# Patient Record
Sex: Male | Born: 1985 | Hispanic: Yes | Marital: Single | Smoking: Former smoker
Health system: Southern US, Community
[De-identification: ages and names within clinical notes are randomized; demographics above are authoritative.]

---

## 2012-05-19 ENCOUNTER — Emergency Department (HOSPITAL_COMMUNITY)
Admission: EM | Admit: 2012-05-19 | Discharge: 2012-05-20 | Disposition: A | Discharge status: Home / Self Care | Payer: No Typology Code available for payment source | Attending: Emergency Medicine | Admitting: Emergency Medicine

## 2012-05-19 ENCOUNTER — Emergency Department (HOSPITAL_COMMUNITY): Payer: Self-pay

## 2012-05-19 ENCOUNTER — Emergency Department (HOSPITAL_COMMUNITY): Payer: No Typology Code available for payment source

## 2012-05-19 ENCOUNTER — Encounter (HOSPITAL_COMMUNITY): Payer: Self-pay | Admitting: Emergency Medicine

## 2012-05-19 DIAGNOSIS — M79609 Pain in unspecified limb: Secondary | ICD-10-CM | POA: Insufficient documentation

## 2012-05-19 DIAGNOSIS — F10929 Alcohol use, unspecified with intoxication, unspecified: Secondary | ICD-10-CM

## 2012-05-19 DIAGNOSIS — F1010 Alcohol abuse, uncomplicated: Secondary | ICD-10-CM | POA: Insufficient documentation

## 2012-05-19 DIAGNOSIS — M25552 Pain in left hip: Secondary | ICD-10-CM

## 2012-05-19 DIAGNOSIS — M25559 Pain in unspecified hip: Secondary | ICD-10-CM | POA: Insufficient documentation

## 2012-05-19 DIAGNOSIS — T1490XA Injury, unspecified, initial encounter: Secondary | ICD-10-CM | POA: Insufficient documentation

## 2012-05-19 DIAGNOSIS — IMO0002 Reserved for concepts with insufficient information to code with codable children: Secondary | ICD-10-CM | POA: Insufficient documentation

## 2012-05-19 LAB — CBC
Hemoglobin: 18 g/dL — ABNORMAL HIGH (ref 13.0–17.0)
MCH: 31.7 pg (ref 26.0–34.0)
MCHC: 36 g/dL (ref 30.0–36.0)
Platelets: 264 10*3/uL (ref 150–400)
RDW: 13.1 % (ref 11.5–15.5)

## 2012-05-19 LAB — SAMPLE TO BLOOD BANK

## 2012-05-19 LAB — PROTIME-INR: Prothrombin Time: 13.3 seconds (ref 11.6–15.2)

## 2012-05-19 LAB — COMPREHENSIVE METABOLIC PANEL
ALT: 410 U/L — ABNORMAL HIGH (ref 0–53)
AST: 154 U/L — ABNORMAL HIGH (ref 0–37)
Alkaline Phosphatase: 94 U/L (ref 39–117)
CO2: 21 mEq/L (ref 19–32)
Chloride: 100 mEq/L (ref 96–112)
GFR calc Af Amer: >90 mL/min (ref >90)
GFR calc non Af Amer: >90 mL/min (ref >90)
Glucose, Bld: 108 mg/dL — ABNORMAL HIGH (ref 70–99)
Sodium: 139 mEq/L (ref 135–145)
Total Bilirubin: 0.3 mg/dL (ref 0.3–1.2)

## 2012-05-19 LAB — POCT I-STAT, CHEM 8
BUN: 4 mg/dL — ABNORMAL LOW (ref 6–23)
Hemoglobin: 19 g/dL — ABNORMAL HIGH (ref 13.0–17.0)
Potassium: 3.6 mEq/L (ref 3.5–5.1)
Sodium: 142 mEq/L (ref 135–145)
TCO2: 21 mmol/L (ref 0–100)

## 2012-05-19 LAB — ETHANOL: Alcohol, Ethyl (B): 301 mg/dL — ABNORMAL HIGH (ref 0–11)

## 2012-05-19 MED ORDER — ONDANSETRON HCL 4 MG/2ML IJ SOLN
4.0000 mg | Freq: Once | INTRAMUSCULAR | Status: AC
  Administered 2012-05-20: 4 mg via INTRAVENOUS
  Filled 2012-05-19: qty 2

## 2012-05-19 MED ORDER — SODIUM CHLORIDE 0.9 % IV BOLUS (SEPSIS)
1000.0000 mL | Freq: Once | INTRAVENOUS | Status: AC
  Administered 2012-05-20: 1000 mL via INTRAVENOUS

## 2012-05-19 MED ORDER — FENTANYL CITRATE 0.05 MG/ML IJ SOLN
50.0000 ug | Freq: Once | INTRAMUSCULAR | Status: AC
  Administered 2012-05-20: 50 ug via INTRAVENOUS
  Filled 2012-05-19: qty 2
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
## 2012-05-19 NOTE — ED Notes (Signed)
Patient transported to X-ray 

## 2012-05-19 NOTE — Progress Notes (Signed)
Orthopedic Tech Progress Note Patient Details:  David Calhoun 05/09/1982 [id number]  Patient ID: [name], male   DOB: 05/09/1982, 26 y.o.   MRN: [id number] Made trauma visit  Nikki Dom 05/19/2012, 9:32 PM
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
## 2012-05-20 LAB — URINALYSIS, MICROSCOPIC ONLY
Glucose, UA: NEGATIVE mg/dL
Ketones, ur: NEGATIVE mg/dL
Leukocytes, UA: NEGATIVE
Protein, ur: NEGATIVE mg/dL
Urobilinogen, UA: 0.2 mg/dL (ref 0.0–1.0)

## 2012-05-20 LAB — CDS SEROLOGY

## 2012-05-20 MED ORDER — HYDROCODONE-ACETAMINOPHEN 5-500 MG PO TABS: 1.0000 | ORAL_TABLET | Freq: Four times a day (QID) | ORAL | Status: AC | PRN

## 2012-05-20 MED ORDER — IBUPROFEN 600 MG PO TABS: 600.0000 mg | ORAL_TABLET | Freq: Four times a day (QID) | ORAL | Status: AC | PRN

## 2012-05-20 NOTE — ED Provider Notes (Signed)
I saw and evaluated the patient, reviewed the resident's note and I agree with the findings and plan.  Adja Ruff T Nyasha Rahilly, MD 05/20/12 0926 

## 2013-03-07 ENCOUNTER — Emergency Department (HOSPITAL_COMMUNITY): Payer: Self-pay

## 2013-03-07 ENCOUNTER — Emergency Department (HOSPITAL_COMMUNITY)
Admission: EM | Admit: 2013-03-07 | Discharge: 2013-03-07 | Disposition: A | Discharge status: Home / Self Care | Payer: Self-pay | Attending: Emergency Medicine | Admitting: Emergency Medicine

## 2013-03-07 ENCOUNTER — Encounter (HOSPITAL_COMMUNITY): Payer: Self-pay | Admitting: *Deleted

## 2013-03-07 DIAGNOSIS — W268XXA Contact with other sharp object(s), not elsewhere classified, initial encounter: Secondary | ICD-10-CM | POA: Insufficient documentation

## 2013-03-07 DIAGNOSIS — Y9301 Activity, walking, marching and hiking: Secondary | ICD-10-CM | POA: Insufficient documentation

## 2013-03-07 DIAGNOSIS — S91311A Laceration without foreign body, right foot, initial encounter: Secondary | ICD-10-CM

## 2013-03-07 DIAGNOSIS — S91309A Unspecified open wound, unspecified foot, initial encounter: Secondary | ICD-10-CM | POA: Insufficient documentation

## 2013-03-07 DIAGNOSIS — M2550 Pain in unspecified joint: Secondary | ICD-10-CM | POA: Insufficient documentation

## 2013-03-07 DIAGNOSIS — Y9289 Other specified places as the place of occurrence of the external cause: Secondary | ICD-10-CM | POA: Insufficient documentation

## 2013-03-07 DIAGNOSIS — Z23 Encounter for immunization: Secondary | ICD-10-CM | POA: Insufficient documentation

## 2013-03-07 MED ORDER — IBUPROFEN 600 MG PO TABS: 600.0000 mg | ORAL_TABLET | Freq: Four times a day (QID) | ORAL | Status: DC | PRN

## 2013-03-07 MED ORDER — TETANUS-DIPHTH-ACELL PERTUSSIS 5-2.5-18.5 LF-MCG/0.5 IM SUSP
0.5000 mL | Freq: Once | INTRAMUSCULAR | Status: AC
  Administered 2013-03-07: 0.5 mL via INTRAMUSCULAR
  Filled 2013-03-07: qty 0.5

## 2013-03-07 MED ORDER — BACITRACIN ZINC 500 UNIT/GM EX OINT: TOPICAL_OINTMENT | Freq: Two times a day (BID) | CUTANEOUS | Status: DC

## 2013-03-07 MED ORDER — CEPHALEXIN 500 MG PO CAPS: 500.0000 mg | ORAL_CAPSULE | Freq: Two times a day (BID) | ORAL | Status: DC
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
## 2013-03-07 NOTE — ED Provider Notes (Signed)
Medical screening examination/treatment/procedure(s) were performed by non-physician practitioner and as supervising physician I was immediately available for consultation/collaboration.   Richardean Canal, MD 03/07/13 [phone]

## 2013-03-07 NOTE — ED Notes (Signed)
Patient transported to X-ray 
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
## 2013-03-07 NOTE — ED Notes (Signed)
Lac to bottom of right foot. Bleeding controlled

## 2013-03-17 ENCOUNTER — Encounter (HOSPITAL_COMMUNITY): Payer: Self-pay | Admitting: Emergency Medicine

## 2013-03-17 ENCOUNTER — Emergency Department (HOSPITAL_COMMUNITY)
Admission: EM | Admit: 2013-03-17 | Discharge: 2013-03-18 | Disposition: A | Discharge status: Home / Self Care | Payer: Self-pay | Attending: Emergency Medicine | Admitting: Emergency Medicine

## 2013-03-17 DIAGNOSIS — IMO0002 Reserved for concepts with insufficient information to code with codable children: Secondary | ICD-10-CM

## 2013-03-17 DIAGNOSIS — R45851 Suicidal ideations: Secondary | ICD-10-CM | POA: Insufficient documentation

## 2013-03-17 DIAGNOSIS — S51809A Unspecified open wound of unspecified forearm, initial encounter: Secondary | ICD-10-CM | POA: Insufficient documentation

## 2013-03-17 DIAGNOSIS — F1010 Alcohol abuse, uncomplicated: Secondary | ICD-10-CM | POA: Diagnosis present

## 2013-03-17 DIAGNOSIS — X789XXA Intentional self-harm by unspecified sharp object, initial encounter: Secondary | ICD-10-CM

## 2013-03-17 DIAGNOSIS — F17200 Nicotine dependence, unspecified, uncomplicated: Secondary | ICD-10-CM | POA: Insufficient documentation

## 2013-03-17 LAB — COMPREHENSIVE METABOLIC PANEL
ALT: 181 U/L — ABNORMAL HIGH (ref 0–53)
AST: 52 U/L — ABNORMAL HIGH (ref 0–37)
Albumin: 4.4 g/dL (ref 3.5–5.2)
Alkaline Phosphatase: 86 U/L (ref 39–117)
Calcium: 9.2 mg/dL (ref 8.4–10.5)
GFR calc Af Amer: >90 mL/min (ref >90)
Glucose, Bld: 111 mg/dL — ABNORMAL HIGH (ref 70–99)
Potassium: 3.8 mEq/L (ref 3.5–5.1)
Sodium: 140 mEq/L (ref 135–145)
Total Protein: 8.1 g/dL (ref 6.0–8.3)

## 2013-03-17 LAB — CBC WITH DIFFERENTIAL/PLATELET
Basophils Absolute: 0 10*3/uL (ref 0.0–0.1)
Eosinophils Absolute: 0.1 10*3/uL (ref 0.0–0.7)
Eosinophils Relative: 1 % (ref 0–5)
Lymphocytes Relative: 34 % (ref 12–46)
Lymphs Abs: 3.3 10*3/uL (ref 0.7–4.0)
MCH: 30.7 pg (ref 26.0–34.0)
Neutrophils Relative %: 56 % (ref 43–77)
Platelets: 312 10*3/uL (ref 150–400)
RBC: 5.5 MIL/uL (ref 4.22–5.81)
RDW: 12.9 % (ref 11.5–15.5)
WBC: 9.6 10*3/uL (ref 4.0–10.5)

## 2013-03-17 LAB — ETHANOL: Alcohol, Ethyl (B): 343 mg/dL — ABNORMAL HIGH (ref 0–11)
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
## 2013-03-17 NOTE — ED Provider Notes (Signed)
History    CSN: [id number] Arrival date & time 03/17/13  2149  First MD Initiated Contact with Patient 03/17/13 2159     Chief Complaint  Patient presents with  . Suicidal  . Alcohol Intoxication   (Consider location/radiation/quality/duration/timing/severity/associated sxs/prior Treatment) HPI Comments: 27 yo male, Spanish speaking, interpreter used presents with suicidal ideation and attempt by cutting left forearm with a knife.  Pt denies past attempts or psych hx.  Pt drinks etoh regularly, 40 beers today.  Pt has multiple stressors, gf recently left him.  Denies ingestion.  Left arm bleeding/ cut.  Tetanus UTD.  Patient is a 27 y.o. male presenting with intoxication. The history is provided by the patient.  Alcohol Intoxication This is a recurrent problem. Pertinent negatives include no chest pain, no abdominal pain, no headaches and no shortness of breath.   History reviewed. No pertinent past medical history. History reviewed. No pertinent past surgical history. History reviewed. No pertinent family history. History  Substance Use Topics  . Smoking status: Current Some Day Smoker  . Smokeless tobacco: Not on file  . Alcohol Use: Yes     Comment: occasional    Review of Systems  Constitutional: Negative for fever and chills.  HENT: Negative for neck pain and neck stiffness.   Eyes: Negative for visual disturbance.  Respiratory: Negative for shortness of breath.   Cardiovascular: Negative for chest pain.  Gastrointestinal: Negative for vomiting and abdominal pain.  Genitourinary: Negative for dysuria and flank pain.  Musculoskeletal: Negative for back pain.  Skin: Positive for wound.  Neurological: Negative for light-headedness and headaches.  Psychiatric/Behavioral: Positive for suicidal ideas, self-injury and dysphoric mood.    Allergies  Review of patient's allergies indicates no known allergies.  Home Medications   Current Outpatient Rx  Name  Route  Sig   Dispense  Refill  . bacitracin ointment   Topical   Apply topically 2 (two) times daily.   120 g   0   . calcium carbonate (TUMS - DOSED IN MG ELEMENTAL CALCIUM) 500 MG chewable tablet   Oral   Chew 1 tablet by mouth daily.         . cephALEXin (KEFLEX) 500 MG capsule   Oral   Take 1 capsule (500 mg total) by mouth 2 (two) times daily.   14 capsule   0   . ibuprofen (ADVIL,MOTRIN) 600 MG tablet   Oral   Take 1 tablet (600 mg total) by mouth every 6 (six) hours as needed for pain.   30 tablet   0    BP 131/58  Pulse 107  Temp(Src) 98.5 F (36.9 C) (Oral)  Resp 18  SpO2 94% Physical Exam  Nursing note and vitals reviewed. Constitutional: He is oriented to person, place, and time. He appears well-developed and well-nourished.  HENT:  Head: Normocephalic and atraumatic.  Eyes: Conjunctivae are normal. Right eye exhibits no discharge. Left eye exhibits no discharge.  Neck: Normal range of motion. Neck supple. No tracheal deviation present.  Cardiovascular: Normal rate and regular rhythm.   Pulmonary/Chest: Effort normal and breath sounds normal.  Abdominal: Soft. He exhibits no distension. There is no tenderness. There is no guarding.  Musculoskeletal: He exhibits tenderness. He exhibits no edema.  Neurological: He is alert and oriented to person, place, and time. No cranial nerve deficit.  Sensation intact, full grip strength in flexion and full extension of all fingers.   2+ distal pulses in left arm Intermittently joking/ smiling. Clinically intoxicated  Skin: Skin is warm. No rash noted.  6 cm transverse lac to mid forearm with adipose visible/ mild bleeding  Psychiatric: He has a normal mood and affect.    ED Course  Procedures (including critical care time) Labs Reviewed  CBC WITH DIFFERENTIAL  COMPREHENSIVE METABOLIC PANEL  SALICYLATE LEVEL  ACETAMINOPHEN LEVEL  ETHANOL   No results found. No diagnosis found.  MDM  Suicidal and intoxicated.   Plan  for assessment by ACT/ psychi in am when sober. Difficult to assess risk at this time. Pt states wishes he was not alive at this time. LACERATION REPAIR Performed by: Enid Skeens Authorized by: Enid Skeens Consent: Verbal consent obtained. Risks and benefits: risks, benefits and alternatives were discussed Consent given by: patient Patient identity confirmed: provided demographic data Prepped and Draped in normal sterile fashion Wound explored  Laceration Location: left forearm  Laceration Length: 6 cm  No Foreign Bodies seen or palpated  Anesthesia: local infiltration  Local anesthetic: lidocaine 1% 8 ml    Irrigation method: syringe Amount of cleaning: standard  Skin closure: ethilon  Number of sutures: 7  Technique: individual  Patient tolerance: Patient tolerated the procedure well with no immediate complications.  Signed out for night physician to follow and psych in am.    Enid Skeens, MD 03/18/13 [phone]

## 2013-03-18 ENCOUNTER — Encounter (HOSPITAL_COMMUNITY): Payer: Self-pay | Admitting: Registered Nurse

## 2013-03-18 DIAGNOSIS — F1010 Alcohol abuse, uncomplicated: Secondary | ICD-10-CM

## 2013-03-18 DIAGNOSIS — R45851 Suicidal ideations: Secondary | ICD-10-CM

## 2013-03-18 DIAGNOSIS — X789XXA Intentional self-harm by unspecified sharp object, initial encounter: Secondary | ICD-10-CM

## 2013-03-18 LAB — RAPID URINE DRUG SCREEN, HOSP PERFORMED
Amphetamines: NOT DETECTED
Benzodiazepines: NOT DETECTED
Cocaine: NOT DETECTED
Opiates: NOT DETECTED
Tetrahydrocannabinol: NOT DETECTED

## 2013-03-18 MED ORDER — NAPROXEN 500 MG PO TABS: 500.0000 mg | ORAL_TABLET | Freq: Two times a day (BID) | ORAL | Status: DC
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
## 2013-03-18 NOTE — Progress Notes (Signed)
[facility] has seen patient and provided him with a [facility] oc application.

## 2013-03-18 NOTE — Consult Note (Signed)
Reason for Consult: Evaluation for inpatient treatment Referring Physician:  EDP  Branch Sanders is an 27 y.o. male.  HPI:  Patient presented to [facility] after cutting himself with knife.  David Calhoun states that he was drunk and doesn't know why he cut himself.  Patient states that he does not have a history of and psych. problem.  Patient denies SI/HI/psychosis/paranoia.  Patient states that he has no intention to kill himself.  Patient states that he lives with his brother in an apartment and his parents lives close by.  Patient does states that at time he goes on alcohol binges and ins interested in outpatient resources for alcohol and drug services.  History reviewed. No pertinent past medical history.  History reviewed. No pertinent past surgical history.  History reviewed. No pertinent family history.  Social History:  reports that he has been smoking.  He does not have any smokeless tobacco history on file. He reports that  drinks alcohol. He reports that he does not use illicit drugs.  Allergies: No Known Allergies  Medications: I have reviewed the patient's current medications.  Results for orders placed during the hospital encounter of 03/17/13 (from the past 48 hour(s))  CBC WITH DIFFERENTIAL     Status: None   Collection Time    03/17/13 10:37 PM      Result Value Range   WBC 9.6  4.0 - 10.5 K/uL   RBC 5.50  4.22 - 5.81 MIL/uL   Hemoglobin 16.9  13.0 - 17.0 g/dL   HCT 95.6  21.3 - 08.6 %   MCV 86.4  78.0 - 100.0 fL   MCH 30.7  26.0 - 34.0 pg   MCHC 35.6  30.0 - 36.0 g/dL   RDW 57.8  46.9 - 62.9 %   Platelets 312  150 - 400 K/uL   Neutrophils Relative % 56  43 - 77 %   Neutro Abs 5.4  1.7 - 7.7 K/uL   Lymphocytes Relative 34  12 - 46 %   Lymphs Abs 3.3  0.7 - 4.0 K/uL   Monocytes Relative 9  3 - 12 %   Monocytes Absolute 0.8  0.1 - 1.0 K/uL   Eosinophils Relative 1  0 - 5 %   Eosinophils Absolute 0.1  0.0 - 0.7 K/uL   Basophils Relative 0  0 - 1 %   Basophils  Absolute 0.0  0.0 - 0.1 K/uL  COMPREHENSIVE METABOLIC PANEL     Status: Abnormal   Collection Time    03/17/13 10:37 PM      Result Value Range   Sodium 140  135 - 145 mEq/L   Potassium 3.8  3.5 - 5.1 mEq/L   Chloride 102  96 - 112 mEq/L   CO2 21  19 - 32 mEq/L   Glucose, Bld 111 (*) 70 - 99 mg/dL   BUN 7  6 - 23 mg/dL   Creatinine, Ser 5.28  0.50 - 1.35 mg/dL   Calcium 9.2  8.4 - 41.3 mg/dL   Total Protein 8.1  6.0 - 8.3 g/dL   Albumin 4.4  3.5 - 5.2 g/dL   AST 52 (*) 0 - 37 U/L   ALT 181 (*) 0 - 53 U/L   Alkaline Phosphatase 86  39 - 117 U/L   Total Bilirubin 0.2 (*) 0.3 - 1.2 mg/dL   GFR calc non Af Amer >90  >90 mL/min   GFR calc Af Amer >90  >90 mL/min   Comment:  The eGFR has been calculated     using the CKD EPI equation.     This calculation has not been     validated in all clinical     situations.     eGFR's persistently     <90 mL/min signify     possible Chronic Kidney Disease.  SALICYLATE LEVEL     Status: Abnormal   Collection Time    03/17/13 10:37 PM      Result Value Range   Salicylate Lvl <2.0 (*) 2.8 - 20.0 mg/dL  ACETAMINOPHEN LEVEL     Status: None   Collection Time    03/17/13 10:37 PM      Result Value Range   Acetaminophen (Tylenol), Serum <15.0  10 - 30 ug/mL   Comment:            THERAPEUTIC CONCENTRATIONS VARY     SIGNIFICANTLY. A RANGE OF 10-30     ug/mL MAY BE AN EFFECTIVE     CONCENTRATION FOR MANY PATIENTS.     HOWEVER, SOME ARE BEST TREATED     AT CONCENTRATIONS OUTSIDE THIS     RANGE.     ACETAMINOPHEN CONCENTRATIONS     >150 ug/mL AT 4 HOURS AFTER     INGESTION AND >50 ug/mL AT 12     HOURS AFTER INGESTION ARE     OFTEN ASSOCIATED WITH TOXIC     REACTIONS.  ETHANOL     Status: Abnormal   Collection Time    03/17/13 10:37 PM      Result Value Range   Alcohol, Ethyl (B) 343 (*) 0 - 11 mg/dL   Comment:            LOWEST DETECTABLE LIMIT FOR     SERUM ALCOHOL IS 11 mg/dL     FOR MEDICAL PURPOSES ONLY  URINE RAPID  DRUG SCREEN (HOSP PERFORMED)     Status: None   Collection Time    03/17/13 10:52 PM      Result Value Range   Opiates NONE DETECTED  NONE DETECTED   Cocaine NONE DETECTED  NONE DETECTED   Benzodiazepines NONE DETECTED  NONE DETECTED   Amphetamines NONE DETECTED  NONE DETECTED   Tetrahydrocannabinol NONE DETECTED  NONE DETECTED   Barbiturates NONE DETECTED  NONE DETECTED   Comment:            DRUG SCREEN FOR MEDICAL PURPOSES     ONLY.  IF CONFIRMATION IS NEEDED     FOR ANY PURPOSE, NOTIFY LAB     WITHIN 5 DAYS.                LOWEST DETECTABLE LIMITS     FOR URINE DRUG SCREEN     Drug Class       Cutoff (ng/mL)     Amphetamine      1000     Barbiturate      200     Benzodiazepine   200     Tricyclics       300     Opiates          300     Cocaine          300     THC              50    No results found.  Review of Systems  Psychiatric/Behavioral: Positive for substance abuse (Alcohol). Negative for depression, suicidal ideas (David Calhoun states that he was intoxicated  and had no intention of trying to kill himself.  States that he feel "because I was drunk.  I don't want to die and wasn't trying to kill myself."), hallucinations and memory loss. The patient is not nervous/anxious and does not have insomnia.   All other systems reviewed and are negative.   Blood pressure 144/81, pulse 108, temperature 99.1 F (37.3 C), temperature source Oral, resp. rate 17, SpO2 98.00%. Physical Exam  Constitutional: He is oriented to person, place, and time. He appears well-developed and well-nourished.  HENT:  Head: Normocephalic and atraumatic.  Neck: Normal range of motion. Neck supple.  Respiratory: Effort normal.  Musculoskeletal: Normal range of motion.  Neurological: He is alert and oriented to person, place, and time.  Skin: Skin is warm and dry.  Self inflicted laceration to left inner forearm with sutures.   Psychiatric: His speech is normal and behavior is normal. Judgment and  thought content normal. His mood appears not anxious. His affect is not angry. He is not agitated, not aggressive, not withdrawn, not actively hallucinating and not combative. Thought content is not paranoid and not delusional. Cognition and memory are normal. He does not exhibit a depressed mood. He expresses no homicidal ideation.   Face to face interview and consult with Dr. Lolly Mustache Assessment/Plan: Axis I: Suicidal Ideation and Suicidal Attempt   Recommendation:  Discharge home with Resources for drug/alcohol assistance programs  Rankin, Shuvon,FNP-BC 03/18/2013, 12:49 PM     I have personally seen the patient and agreed with the findings and involved in the treatment plan. Kathryne Sharper, MD

## 2013-03-18 NOTE — ED Provider Notes (Signed)
1:15 AM d/w [name] from [facility] - she will evaluate when sober  David Nielsen, MD 03/18/13 [phone]

## 2013-03-24 ENCOUNTER — Encounter (HOSPITAL_COMMUNITY): Payer: Self-pay | Admitting: *Deleted

## 2013-03-24 ENCOUNTER — Emergency Department (HOSPITAL_COMMUNITY)
Admission: EM | Admit: 2013-03-24 | Discharge: 2013-03-24 | Disposition: A | Discharge status: Home / Self Care | Payer: Self-pay | Attending: Emergency Medicine | Admitting: Emergency Medicine

## 2013-03-24 DIAGNOSIS — Z4802 Encounter for removal of sutures: Secondary | ICD-10-CM | POA: Insufficient documentation

## 2013-03-24 DIAGNOSIS — F17200 Nicotine dependence, unspecified, uncomplicated: Secondary | ICD-10-CM | POA: Insufficient documentation

## 2013-03-24 DIAGNOSIS — Z79899 Other long term (current) drug therapy: Secondary | ICD-10-CM | POA: Insufficient documentation

## 2013-03-24 NOTE — ED Notes (Signed)
Pt is here to get stitches removed from L lower arm, has had x 1 week, no problems.

## 2013-03-24 NOTE — ED Provider Notes (Signed)
History    CSN: [id number] Arrival date & time 03/24/13  1137  First MD Initiated Contact with Patient 03/24/13 1159     Chief Complaint  Patient presents with  . Suture / Staple Removal   (Consider location/radiation/quality/duration/timing/severity/associated sxs/prior Treatment) HPI Comments: Patient presents for suture removal. The laceration is located on his left forearm. The patient reports subjective healing of the laceration and denies any complications or concerns. The patient denies any pain, erythema, tenderness, drainage of the site. Denies fever, NVD, abdominal pain, numbness/tingling, skin color changes.    Patient is a 27 y.o. male presenting with suture removal.  Suture / Staple Removal   History reviewed. No pertinent past medical history. History reviewed. No pertinent past surgical history. No family history on file. History  Substance Use Topics  . Smoking status: Current Some Day Smoker  . Smokeless tobacco: Not on file  . Alcohol Use: Yes     Comment: occasional    Review of Systems  Skin: Positive for wound.  All other systems reviewed and are negative.    Allergies  Review of patient's allergies indicates no known allergies.  Home Medications   Current Outpatient Rx  Name  Route  Sig  Dispense  Refill  . bacitracin ointment   Topical   Apply topically 2 (two) times daily.   120 g   0   . calcium carbonate (TUMS - DOSED IN MG ELEMENTAL CALCIUM) 500 MG chewable tablet   Oral   Chew 1 tablet by mouth daily.         . cephALEXin (KEFLEX) 500 MG capsule   Oral   Take 1 capsule (500 mg total) by mouth 2 (two) times daily.   14 capsule   0   . ibuprofen (ADVIL,MOTRIN) 600 MG tablet   Oral   Take 1 tablet (600 mg total) by mouth every 6 (six) hours as needed for pain.   30 tablet   0   . naproxen (NAPROSYN) 500 MG tablet   Oral   Take 1 tablet (500 mg total) by mouth 2 (two) times daily with a meal.   30 tablet   0    BP  123/70  Pulse 72  Temp(Src) 98.2 F (36.8 C) (Oral)  Resp 18  SpO2 100% Physical Exam  Nursing note and vitals reviewed. Constitutional: He is oriented to person, place, and time. He appears well-developed and well-nourished. No distress.  HENT:  Head: Normocephalic and atraumatic.  Eyes: Conjunctivae are normal.  Neck: Normal range of motion.  Cardiovascular: Normal rate and regular rhythm.  Exam reveals no gallop and no friction rub.   No murmur heard. Pulmonary/Chest: Effort normal and breath sounds normal. He has no wheezes. He has no rales. He exhibits no tenderness.  Abdominal: Soft. There is no tenderness.  Musculoskeletal: Normal range of motion.  Neurological: He is alert and oriented to person, place, and time. Coordination normal.  Speech is goal-oriented. Moves limbs without ataxia.   Skin: Skin is warm and dry.  4cm laceration of left forearm with sutures intact.   Psychiatric: He has a normal mood and affect. His behavior is normal.    ED Course  Procedures (including critical care time)  SUTURE REMOVAL Performed by: Emilia Beck  Consent: Verbal consent obtained. Patient identity confirmed: provided demographic data Time out: Immediately prior to procedure a "time out" was called to verify the correct patient, procedure, equipment, support staff and site/side marked as required.  Location details: left  forearm  Wound Appearance: clean  Sutures/Staples Removed: 2 (the remainder left intact due to non healing of the laceration)  Facility: sutures placed in this facility Patient tolerance: Patient tolerated the procedure well with no immediate complications.     Labs Reviewed - No data to display No results found. 1. Visit for suture removal     MDM  12:21 PM Patient's laceration is not fully healed. Patient instructed to return in 5 days for suture removal. Patient understands. No signs of infection noted.   Emilia Beck, PA-C 03/24/13  1225

## 2013-03-25 NOTE — ED Provider Notes (Signed)
Medical screening examination/treatment/procedure(s) were performed by non-physician practitioner and as supervising physician I was immediately available for consultation/collaboration.  Toy Baker, MD 03/25/13 [phone]

## 2013-03-30 ENCOUNTER — Encounter (HOSPITAL_COMMUNITY): Payer: Self-pay | Admitting: Emergency Medicine

## 2013-03-30 ENCOUNTER — Emergency Department (HOSPITAL_COMMUNITY)
Admission: EM | Admit: 2013-03-30 | Discharge: 2013-03-30 | Disposition: A | Discharge status: Home / Self Care | Payer: Self-pay | Attending: Emergency Medicine | Admitting: Emergency Medicine

## 2013-03-30 DIAGNOSIS — Z4802 Encounter for removal of sutures: Secondary | ICD-10-CM | POA: Insufficient documentation

## 2013-03-30 DIAGNOSIS — F17200 Nicotine dependence, unspecified, uncomplicated: Secondary | ICD-10-CM | POA: Insufficient documentation

## 2013-03-30 NOTE — ED Provider Notes (Signed)
CSN: [id number]     Arrival date & time 03/30/13  1106 History     First MD Initiated Contact with Patient 03/30/13 1107     Chief Complaint  Patient presents with  . Suture / Staple Removal   (Consider location/radiation/quality/duration/timing/severity/associated sxs/prior Treatment) HPI Comments: Patient is a 27 year old male who presents today for suture removal from a laceration on his left forearm. He sustained a laceration on 7/14. At that time a laceration repair was done with 7 sutures placed. He has not had any issues since that time. He was seen on 7/21 and told that his sutures are not ready to come out. At that time two sutures were taken out. No fevers, chills, nausea, vomiting, abdominal pain.  The history is provided by the patient. No language interpreter was used.    No past medical history on file. No past surgical history on file. No family history on file. History  Substance Use Topics  . Smoking status: Current Some Day Smoker  . Smokeless tobacco: Not on file  . Alcohol Use: Yes     Comment: occasional    Review of Systems  Constitutional: Negative for fever and chills.  Skin: Positive for wound.  All other systems reviewed and are negative.    Allergies  Review of patient's allergies indicates no known allergies.  Home Medications   Current Outpatient Rx  Name  Route  Sig  Dispense  Refill  . bacitracin ointment   Topical   Apply topically 2 (two) times daily.   120 g   0   . calcium carbonate (TUMS - DOSED IN MG ELEMENTAL CALCIUM) 500 MG chewable tablet   Oral   Chew 1 tablet by mouth daily.         . cephALEXin (KEFLEX) 500 MG capsule   Oral   Take 1 capsule (500 mg total) by mouth 2 (two) times daily.   14 capsule   0   . ibuprofen (ADVIL,MOTRIN) 600 MG tablet   Oral   Take 1 tablet (600 mg total) by mouth every 6 (six) hours as needed for pain.   30 tablet   0   . naproxen (NAPROSYN) 500 MG tablet   Oral   Take 1 tablet  (500 mg total) by mouth 2 (two) times daily with a meal.   30 tablet   0    BP 116/61  Pulse 77  Temp(Src) 98.8 F (37.1 C) (Oral)  SpO2 100% Physical Exam  Nursing note and vitals reviewed. Constitutional: He is oriented to person, place, and time. He appears well-developed and well-nourished. No distress.  HENT:  Head: Normocephalic and atraumatic.  Right Ear: External ear normal.  Left Ear: External ear normal.  Nose: Nose normal.  Eyes: Conjunctivae are normal.  Neck: Normal range of motion. No tracheal deviation present.  Cardiovascular: Normal rate, regular rhythm and normal heart sounds.   Pulmonary/Chest: Effort normal and breath sounds normal. No stridor.  Abdominal: Soft. He exhibits no distension. There is no tenderness.  Musculoskeletal: Normal range of motion.  Neurological: He is alert and oriented to person, place, and time.  Skin: Skin is warm and dry. He is not diaphoretic.  Well-healing laceration on left forearm. 5 Sutures in place.   Psychiatric: He has a normal mood and affect. His behavior is normal.    ED Course   Procedures (including critical care time)  SUTURE REMOVAL Performed by: Junious Silk  Consent: Verbal consent obtained. Patient identity confirmed: provided demographic  data Time out: Immediately prior to procedure a "time out" was called to verify the correct patient, procedure, equipment, support staff and site/side marked as required.  Location details: left forearm  Wound Appearance: clean  Sutures/Staples Removed: 5  Facility: sutures placed in this facility Patient tolerance: Patient tolerated the procedure well with no immediate complications.     Labs Reviewed - No data to display No results found. 1. Visit for suture removal     MDM  Pt to ER for staple/suture removal and wound check as above. Procedure tolerated well. Vitals normal, no signs of infection. Scar minimization & return precautions given at dc.    Mora Bellman, PA-C 03/30/13 1138

## 2013-03-30 NOTE — ED Provider Notes (Signed)
Medical screening examination/treatment/procedure(s) were performed by non-physician practitioner and as supervising physician I was immediately available for consultation/collaboration.   Aqil Goetting, MD 03/30/13 1534 

## 2013-03-30 NOTE — ED Notes (Signed)
L/forearm suture removal

## 2013-04-01 ENCOUNTER — Ambulatory Visit: Payer: Self-pay | Attending: Family Medicine

## 2013-12-17 ENCOUNTER — Encounter (HOSPITAL_COMMUNITY): Payer: Self-pay | Admitting: Emergency Medicine

## 2013-12-17 ENCOUNTER — Emergency Department (HOSPITAL_COMMUNITY): Payer: Self-pay

## 2013-12-17 ENCOUNTER — Inpatient Hospital Stay (HOSPITAL_COMMUNITY)
Admission: EM | Admit: 2013-12-17 | Discharge: 2013-12-18 | DRG: 053 | Disposition: A | Discharge status: Home / Self Care | Payer: Self-pay | Attending: Internal Medicine | Admitting: Internal Medicine

## 2013-12-17 ENCOUNTER — Inpatient Hospital Stay (HOSPITAL_COMMUNITY): Payer: Self-pay

## 2013-12-17 DIAGNOSIS — R5383 Other fatigue: Secondary | ICD-10-CM

## 2013-12-17 DIAGNOSIS — F1010 Alcohol abuse, uncomplicated: Secondary | ICD-10-CM | POA: Diagnosis present

## 2013-12-17 DIAGNOSIS — Z23 Encounter for immunization: Secondary | ICD-10-CM

## 2013-12-17 DIAGNOSIS — M25569 Pain in unspecified knee: Secondary | ICD-10-CM | POA: Diagnosis present

## 2013-12-17 DIAGNOSIS — R200 Anesthesia of skin: Secondary | ICD-10-CM

## 2013-12-17 DIAGNOSIS — R29898 Other symptoms and signs involving the musculoskeletal system: Secondary | ICD-10-CM

## 2013-12-17 DIAGNOSIS — R5381 Other malaise: Secondary | ICD-10-CM | POA: Diagnosis present

## 2013-12-17 DIAGNOSIS — R269 Unspecified abnormalities of gait and mobility: Secondary | ICD-10-CM | POA: Diagnosis present

## 2013-12-17 DIAGNOSIS — R197 Diarrhea, unspecified: Secondary | ICD-10-CM | POA: Diagnosis present

## 2013-12-17 DIAGNOSIS — G8220 Paraplegia, unspecified: Principal | ICD-10-CM | POA: Diagnosis present

## 2013-12-17 DIAGNOSIS — R209 Unspecified disturbances of skin sensation: Secondary | ICD-10-CM | POA: Diagnosis present

## 2013-12-17 DIAGNOSIS — W19XXXA Unspecified fall, initial encounter: Secondary | ICD-10-CM | POA: Diagnosis present

## 2013-12-17 DIAGNOSIS — F17200 Nicotine dependence, unspecified, uncomplicated: Secondary | ICD-10-CM | POA: Diagnosis present

## 2013-12-17 LAB — RAPID URINE DRUG SCREEN, HOSP PERFORMED
Amphetamines: NOT DETECTED
BARBITURATES: NOT DETECTED
Benzodiazepines: NOT DETECTED
Cocaine: NOT DETECTED
OPIATES: NOT DETECTED
TETRAHYDROCANNABINOL: NOT DETECTED

## 2013-12-17 LAB — ETHANOL: ALCOHOL ETHYL (B): 233 mg/dL — AB (ref 0–11)

## 2013-12-17 LAB — URINALYSIS, ROUTINE W REFLEX MICROSCOPIC
Bilirubin Urine: NEGATIVE
Glucose, UA: NEGATIVE mg/dL
HGB URINE DIPSTICK: NEGATIVE
KETONES UR: NEGATIVE mg/dL
Leukocytes, UA: NEGATIVE
Nitrite: NEGATIVE
PROTEIN: NEGATIVE mg/dL
Specific Gravity, Urine: 1.002 — ABNORMAL LOW (ref 1.005–1.030)
UROBILINOGEN UA: 0.2 mg/dL (ref 0.0–1.0)
pH: 6 (ref 5.0–8.0)

## 2013-12-17 LAB — CBC WITH DIFFERENTIAL/PLATELET
BASOS ABS: 0 10*3/uL (ref 0.0–0.1)
Basophils Relative: 0 % (ref 0–1)
Eosinophils Absolute: 0.1 10*3/uL (ref 0.0–0.7)
Eosinophils Relative: 1 % (ref 0–5)
HEMATOCRIT: 48.5 % (ref 39.0–52.0)
HEMOGLOBIN: 17.1 g/dL — AB (ref 13.0–17.0)
LYMPHS PCT: 25 % (ref 12–46)
Lymphs Abs: 2.3 10*3/uL (ref 0.7–4.0)
MCH: 31.1 pg (ref 26.0–34.0)
MCHC: 35.3 g/dL (ref 30.0–36.0)
MCV: 88.2 fL (ref 78.0–100.0)
MONO ABS: 0.8 10*3/uL (ref 0.1–1.0)
MONOS PCT: 9 % (ref 3–12)
NEUTROS ABS: 5.7 10*3/uL (ref 1.7–7.7)
Neutrophils Relative %: 64 % (ref 43–77)
Platelets: 279 10*3/uL (ref 150–400)
RBC: 5.5 MIL/uL (ref 4.22–5.81)
RDW: 12.9 % (ref 11.5–15.5)
WBC: 8.9 10*3/uL (ref 4.0–10.5)

## 2013-12-17 LAB — COMPREHENSIVE METABOLIC PANEL
ALK PHOS: 87 U/L (ref 39–117)
ALT: 71 U/L — ABNORMAL HIGH (ref 0–53)
AST: 47 U/L — AB (ref 0–37)
Albumin: 4.8 g/dL (ref 3.5–5.2)
BILIRUBIN TOTAL: 0.5 mg/dL (ref 0.3–1.2)
BUN: 9 mg/dL (ref 6–23)
CHLORIDE: 98 meq/L (ref 96–112)
CO2: 23 meq/L (ref 19–32)
CREATININE: 0.48 mg/dL — AB (ref 0.50–1.35)
Calcium: 9.9 mg/dL (ref 8.4–10.5)
GFR calc Af Amer: >90 mL/min (ref >90)
Glucose, Bld: 100 mg/dL — ABNORMAL HIGH (ref 70–99)
Potassium: 3.9 mEq/L (ref 3.7–5.3)
Sodium: 140 mEq/L (ref 137–147)
Total Protein: 8.6 g/dL — ABNORMAL HIGH (ref 6.0–8.3)

## 2013-12-17 LAB — HIV ANTIBODY (ROUTINE TESTING W REFLEX): HIV 1&2 Ab, 4th Generation: NONREACTIVE

## 2013-12-17 MED ORDER — ENOXAPARIN SODIUM 40 MG/0.4ML ~~LOC~~ SOLN
40.0000 mg | Freq: Every day | SUBCUTANEOUS | Status: DC
  Filled 2013-12-17: qty 0.4

## 2013-12-17 MED ORDER — GADOBENATE DIMEGLUMINE 529 MG/ML IV SOLN
20.0000 mL | Freq: Once | INTRAVENOUS | Status: AC
  Administered 2013-12-17: 20 mL via INTRAVENOUS

## 2013-12-17 MED ORDER — PNEUMOCOCCAL VAC POLYVALENT 25 MCG/0.5ML IJ INJ
0.5000 mL | INJECTION | INTRAMUSCULAR | Status: AC
  Administered 2013-12-18: 0.5 mL via INTRAMUSCULAR
  Filled 2013-12-17: qty 0.5

## 2013-12-17 MED ORDER — SODIUM CHLORIDE 0.9 % IV BOLUS (SEPSIS)
1000.0000 mL | Freq: Once | INTRAVENOUS | Status: AC
  Administered 2013-12-17: 1000 mL via INTRAVENOUS

## 2013-12-17 NOTE — ED Notes (Signed)
[employer] used to all information.

## 2013-12-17 NOTE — ED Notes (Signed)
Per [facility] pt reports no feeling and unable to move bilateral lower extremities from the knees down; delayed cape refill. Reports 4 beers tonight. No reflexes to lower extremities.

## 2013-12-17 NOTE — ED Notes (Signed)
MD at bedside. 

## 2013-12-17 NOTE — Progress Notes (Signed)
Pt seen by partner Dr. Leroy Kennedyamilo this am.   Patient reports sudden onset of symptoms. On exam, he has an inconsistent exam.  He has preserved reflexes at the knees and ankles.   With preserved reflexes, I feel that GBS is much less likley. Will get MRI brain and C-spine to rule out MS and observe overnight. Will hold off on LP for now as I feel that with sudden onset and preserved reflexes, this is likely low yield.   Ritta SlotMcNeill Kirkpatrick, MD [facility] [phone]  If 7pm- 7am, please page neurology on call as listed in [facility].

## 2013-12-17 NOTE — ED Provider Notes (Signed)
[facility], complains of bilateral lower extremity weakness and numbness. On my exam the patient has inability to straight-leg raise bilaterally however he does have some preserved strength proximally when I lift his legs and asked him to hold them in the air. He is resistant to bend his knee secondary to what he describes as needed pain, decreased sensation to the bilateral lower extremities below the knee to both light touch and pinprick. The patient is unable to ambulate secondary to lower extremity weakness. Neurology consultation requested and appreciated, recommends MRI of the lumbar and thoracic spines followed by spinal tap as inpatient if patient has no findings on MRI. The patient otherwise appears hemodynamically stable.  Change of shift - care signed out to oncoming physician  Vida RollerBrian D Hila Bolding, MD 12/17/13 [phone]

## 2013-12-17 NOTE — H&P (Signed)
Date: 12/17/2013               Patient Name:  David Calhoun MRN: [id number]  DOB: 02/23/1986 Age / Sex: 28 y.o., male   PCP: Pcp Not In System         Medical Service: Internal Medicine Teaching Service         Attending Physician: Dr. Burns SpainElizabeth A Butcher, MD    First Contact: Dr. Yetta BarreJones Pager: [phone]  Second Contact: Dr. Virgina OrganQureshi Pager: [phone]       After Hours (After 5p/  First Contact Pager: [phone]  weekends / holidays): Second Contact Pager: [phone]   Chief Complaint: paralysis of legs  History of Present Illness:  The patient is a 28 YO man with no significant past medical history who is presenting to the hospital with loss of function of his legs. He was drinking alcohol the night prior and went to bed. When he awoke he was unable to move his legs and had lost sensation from the knee down. He denies that anything else was out of the usual. He had some mild diarrhea on Monday however this has since resolved. He denies fevers, chills, SOB, chest pain. He has not been sick recently or around others who are sick. He denies any cold or flulike symptoms in the last month. He denies abdominal pain or nausea or vomiting. He does drink alcohol most nights. He sometimes smokes cigarettes but this is not a regular occurrence for him. He does not do any drugs including cocaine, heroin, marijuana. He does not take any medications that are prescribed for him or for anyone else. He denies headaches now or in the recent past. He denies loss of bladder or bowel. He has feeling from his knees up and no problems with that sensation. He is not having any change in vision or hearing. He denies current or past sexual activity. He is hopeful that his legs will start working again. No problems with his speech.   Meds: No current facility-administered medications for this encounter.   No current outpatient prescriptions on file.  None  Allergies: Allergies as of 12/17/2013  . (No Known  Allergies)   History reviewed. No pertinent past medical history. History reviewed. No pertinent past surgical history. History reviewed. No pertinent family history. History   Social History  . Marital Status: Single    Spouse Name: N/A    Number of Children: N/A  . Years of Education: N/A   Occupational History  . Not on file.   Social History Main Topics  . Smoking status: Current Some Day Smoker  . Smokeless tobacco: Not on file  . Alcohol Use: Yes     Comment: occasional  . Drug Use: No  . Sexual Activity: Not on file   Other Topics Concern  . Not on file   Social History Narrative  . No narrative on file    Review of Systems: A comprehensive 10 point review of systems was performed and other than pertinent positives and negatives listed above in HPI was negative.   Physical Exam: Blood pressure 148/85, pulse 92, temperature 98.7 F (37.1 C), temperature source Oral, resp. rate 18, SpO2 97.00%. General: resting in bed, no apparent distress HEENT: PERRL, EOMI, no scleral icterus Cardiac: S1 S2 normal, no murmur Pulm: clear to auscultation bilaterally, moving normal volumes of air Abd: soft, nontender, nondistended, BS present Ext: warm and well perfused, no pedal edema, some bruising on the toes of the feet on  left and right foot Neuro: alert and oriented X3, cranial nerves II-XII grossly intact, 5/5 strength upper right and left extremity, good reflexes upper right and left extremity, light touch sensation intact face, upper right and left extremity,  Good strength in the hip flexor and extensor right and left, light touch intact above the kneecap right and left with good tone, 0/5 strength right and left lower leg (unable to bend at knee, ankle, lift leg from bed, push down or up with foot), sensation to light touch not present right and left leg at kneecap and below, some minimal toe twitching with effort from the patient, reflexes at the knee diminished but  present  Lab results: Basic Metabolic Panel:  Recent Labs  16/10/96 0121  NA 140  K 3.9  CL 98  CO2 23  GLUCOSE 100*  BUN 9  CREATININE 0.48*  CALCIUM 9.9   Liver Function Tests:  Recent Labs  12/17/13 0121  AST 47*  ALT 71*  ALKPHOS 87  BILITOT 0.5  PROT 8.6*  ALBUMIN 4.8   CBC:  Recent Labs  12/17/13 0121  WBC 8.9  NEUTROABS 5.7  HGB 17.1*  HCT 48.5  MCV 88.2  PLT 279   Urine Drug Screen: Drugs of Abuse     Component Value Date/Time   LABOPIA NONE DETECTED 12/17/2013 0136   COCAINSCRNUR NONE DETECTED 12/17/2013 0136   LABBENZ NONE DETECTED 12/17/2013 0136   AMPHETMU NONE DETECTED 12/17/2013 0136   THCU NONE DETECTED 12/17/2013 0136   LABBARB NONE DETECTED 12/17/2013 0136    Alcohol Level:  Recent Labs  12/17/13 0121  ETH 233*   Urinalysis:  Recent Labs  12/17/13 0136  COLORURINE YELLOW  LABSPEC 1.002*  PHURINE 6.0  GLUCOSEU NEGATIVE  HGBUR NEGATIVE  BILIRUBINUR NEGATIVE  KETONESUR NEGATIVE  PROTEINUR NEGATIVE  UROBILINOGEN 0.2  NITRITE NEGATIVE  LEUKOCYTESUR NEGATIVE   Imaging results:  Mr Thoracic Spine W Wo Contrast  12/17/2013   CLINICAL DATA:  Bilateral lower extremity weakness and numbness.  EXAM: MRI THORACIC AND LUMBAR SPINE WITHOUT AND WITH CONTRAST  TECHNIQUE: Multiplanar and multiecho pulse sequences of the thoracic and lumbar spine were obtained without and with intravenous contrast.  CONTRAST:  20mL MULTIHANCE GADOBENATE DIMEGLUMINE 529 MG/ML IV SOLN  COMPARISON:  None.  FINDINGS: MR THORACIC SPINE FINDINGS  There is a tiny central disc bulge at T5-6 and a slight disc bulge to the right at T6-7. There is no significant neural impingement. The thoracic spinal cord appears normal. No mass lesion or myelopathy. No foraminal or spinal stenosis. There is no fracture or bone destruction. The paraspinal soft tissues appear normal. No pathologic enhancement after contrast administration.  MR LUMBAR SPINE FINDINGS  Normal conus tip at L1-2.   Normal paraspinal soft tissues.  There is no spinal or foraminal stenosis. The discs are normal. No facet arthritis. No neural impingement. Distal spinal cord is normal. No pathologic enhancement after contrast administration.  IMPRESSION: Normal MRIs of the thoracic and lumbar spine.   Electronically Signed   By: Geanie Cooley M.D.   On: 12/17/2013 08:34   Mr Lumbar Spine W Wo Contrast  12/17/2013   CLINICAL DATA:  Bilateral lower extremity weakness and numbness.  EXAM: MRI THORACIC AND LUMBAR SPINE WITHOUT AND WITH CONTRAST  TECHNIQUE: Multiplanar and multiecho pulse sequences of the thoracic and lumbar spine were obtained without and with intravenous contrast.  CONTRAST:  20mL MULTIHANCE GADOBENATE DIMEGLUMINE 529 MG/ML IV SOLN  COMPARISON:  None.  FINDINGS: MR  THORACIC SPINE FINDINGS  There is a tiny central disc bulge at T5-6 and a slight disc bulge to the right at T6-7. There is no significant neural impingement. The thoracic spinal cord appears normal. No mass lesion or myelopathy. No foraminal or spinal stenosis. There is no fracture or bone destruction. The paraspinal soft tissues appear normal. No pathologic enhancement after contrast administration.  MR LUMBAR SPINE FINDINGS  Normal conus tip at L1-2.  Normal paraspinal soft tissues.  There is no spinal or foraminal stenosis. The discs are normal. No facet arthritis. No neural impingement. Distal spinal cord is normal. No pathologic enhancement after contrast administration.  IMPRESSION: Normal MRIs of the thoracic and lumbar spine.   Electronically Signed   By: Geanie CooleyJim  Maxwell M.D.   On: 12/17/2013 08:34   Assessment & Plan by Problem:  Paralysis of both lower limbs - Concerning for ascending paralysis such as Guillain-Barr however will need LP to help make this diagnosis or exclude GBS. Also additional peripheral neuropathies are possible. Conversion disorder is on the differential however would be diagnosis of exclusion once other possibilities  are evaluated. This patient will need an LP for cell count, WBC, and protein. Lumbar and thoracic spine MRI with and without contrast without pathology ruling out an acute spinal cord process.  -Admit to med-surg -Neuro checks q 4 hours for 24 hours over concern for progression -Needs LP -Check HIV  Alcohol abuse - Concern in the past for alcohol abuse and with elevated alcohol level at admission. Will hold on prn ativan order but will monitor CIWA scores. Not in immediate danger of withdrawal as he had elevated levels of alcohol on admission.  -CIWA scores q 6 hours  DVT ppx - Lovenox [place] daily.   Dispo: Disposition is deferred at this time, awaiting improvement of current medical problems. Anticipated discharge in approximately 1-2 day(s).   The patient does not have a current PCP (Pcp Not In System) and does not know [facility] after [facility].  The patient does not have transportation limitations that hinder transportation to clinic appointments.  Signed: Judie BonusElizabeth A Kollar, MD 12/17/2013, 9:24 AM

## 2013-12-17 NOTE — ED Provider Notes (Signed)
TIME SEEN: 1:40 AM   CHIEF COMPLAINT: Lower extremity weakness and numbness  HPI: Patient is a 28 y.o. M with history of tobacco abuse and no other past medical history who presents emergency department with acute onset of bilateral lower extremity numbness and weakness. Patient reports that he drank 4-5 beers today and took a nap around 10 PM. When he woke up around 11 PM and tried to walk to his truck he noticed that he was having numbness from his bilateral knees to his bilateral ankles and difficulty walking. He denies any new back pain. No back injury. No other numbness or weakness. No bowel or bladder incontinence. He states he has had intermittent urinary retention. No fever. No IV drug use. He works as a Education administratorpainter.  ROS: See HPI Constitutional: no fever  Eyes: no drainage  ENT: no runny nose   Cardiovascular:  no chest pain  Resp: no SOB  GI: no vomiting GU: no dysuria Integumentary: no rash  Allergy: no hives  Musculoskeletal: no leg swelling  Neurological: no slurred speech ROS otherwise negative  PAST MEDICAL HISTORY/PAST SURGICAL HISTORY:  History reviewed. No pertinent past medical history.  MEDICATIONS:  Prior to Admission medications   Not on File    ALLERGIES:  No Known Allergies  SOCIAL HISTORY:  History  Substance Use Topics  . Smoking status: Current Some Day Smoker  . Smokeless tobacco: Not on file  . Alcohol Use: Yes     Comment: occasional    FAMILY HISTORY: History reviewed. No pertinent family history.  EXAM: BP 140/68  Pulse 99  Temp(Src) 98.7 F (37.1 C) (Oral)  Resp 18  SpO2 96% CONSTITUTIONAL: Alert and oriented and responds appropriately to questions. Well-appearing; well-nourished HEAD: Normocephalic EYES: Conjunctivae mildly injected bilaterally, PERRL ENT: normal nose; no rhinorrhea; moist mucous membranes; pharynx without lesions noted NECK: Supple, no meningismus, no LAD  CARD: RRR; S1 and S2 appreciated; no murmurs, no clicks, no  rubs, no gallops RESP: Normal chest excursion without splinting or tachypnea; breath sounds clear and equal bilaterally; no wheezes, no rhonchi, no rales,  ABD/GI: Normal bowel sounds; non-distended; soft, non-tender, no rebound, no guarding RECTAL:  Normal rectal tone with normal squeeze, no gross blood or melena BACK:  The back appears normal and is non-tender to palpation, there is no CVA tenderness, no midline spinal tenderness or step-off or deformity EXT: Normal ROM in all joints; non-tender to palpation; no edema; normal capillary refill; no cyanosis    SKIN: Normal color for age and race; warm NEURO: Cranial nerves II through XII intact, patient has 5/5 strength in his bilateral upper extremities, sensation to light touch is diminished from his bilateral knees to his bilateral ankles but he has normal sensation in his dorsal and plantar feet bilaterally and in his thighs. Patient is able to stand but is unable to ambulate. Patient is unable to lift his legs off the bed but will intermittently dorsiflex his feet and spread his toes. When asked to perform movement of his lower extremities he states "I can't". Patient has 1+ bilateral brachial radialis, brachial, triceps, Achilles, patellar deep tendon reflexes. PSYCH: The patient's mood and manner are appropriate. Grooming and personal hygiene are appropriate.  MEDICAL DECISION MAKING: Patient here with bilateral lower extremity weakness and numbness from his knees to his ankles. His postvoid residual is 0 mL's. He has normal rectal tone. No back pain. Patient has a very atypical story. He does appear intoxicated on exam but given his acute onset  of neurologic deficits, concern for possible cauda equina. [facility] for an emergent MRI of patient's lumbar spine. We'll also obtain labs, urine. Will give IV fluids. Discussed with patient and family using interpreter phone. They agree  with plan.      Layla MawKristen N Velmer Broadfoot, DO 12/17/13 [phone]

## 2013-12-17 NOTE — ED Notes (Signed)
Attempted report 

## 2013-12-17 NOTE — ED Notes (Signed)
Patient transported to MRI 

## 2013-12-17 NOTE — ED Notes (Signed)
Patient is alert and oriented x3.  He is here to be seen for difficulty walking.   He states that he was at home last night about 10pm and he began to have  diffculty using his legs.  He denies any numbness.  He denies ever having this issue Before.

## 2013-12-17 NOTE — ED Notes (Signed)
Neurologist MD at bedside. 

## 2013-12-17 NOTE — Consult Note (Addendum)
NEURO HOSPITALIST CONSULT NOTE    Reason for Consult: bilateral LE weakness-numbness.  HPI:                                                                                                                                          David Calhoun is an 28 y.o. 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 He indicated that he had several beers last night, went to bed, woke up to move his truck and when he got to his house his legs suddenly became severely weak and he fell to the floor. He was not able to get up from the floor by himself and had to be helped by his family. Additionally, he reports that his legs feel numb for his knees down, with pain in her right knee when he tries to move the right leg. No pain perineal region.  Denies bladder or bowel impairment, back pain, or trauma.  No fever or infection. Had diarrhea yesterday. Furthermore, denies HA, vertigo, difficulty swallowing, weakness of his upper extremities, double vision, slurred speech, language or vision impairment. No shortness of breath. No recent vaccinations, surgeries, or foreign travel.    History reviewed. No pertinent past medical history.  History reviewed. No pertinent past surgical history.  History reviewed. No pertinent family history.   Social History:  reports that he has been smoking.  He does not have any smokeless tobacco history on file. He reports that he drinks alcohol. He reports that he does not use illicit drugs.  No Known Allergies  MEDICATIONS:                                                                                                                     I have reviewed the patient's current medications.   ROS:  History  obtained from the patient  General ROS: negative for - chills, fatigue, fever, night sweats, weight gain or weight loss Psychological ROS: negative for - behavioral disorder, hallucinations, memory difficulties, mood swings or suicidal ideation Ophthalmic ROS: negative for - blurry vision, double vision, eye pain or loss of vision ENT ROS: negative for - epistaxis, nasal discharge, oral lesions, sore throat, tinnitus or vertigo Allergy and Immunology ROS: negative for - hives or itchy/watery eyes Hematological and Lymphatic ROS: negative for - bleeding problems, bruising or swollen lymph nodes Endocrine ROS: negative for - galactorrhea, hair pattern changes, polydipsia/polyuria or temperature intolerance Respiratory ROS: negative for - cough, hemoptysis, shortness of breath or wheezing Cardiovascular ROS: negative for - chest pain, dyspnea on exertion, edema or irregular heartbeat Gastrointestinal ROS: negative for - abdominal pain, hematemesis, nausea/vomiting or stool incontinence Genito-Urinary ROS: negative for - dysuria, hematuria, incontinence or urinary frequency/urgency Musculoskeletal ROS: negative for - joint swelling or muscular weakness Neurological ROS: as noted in HPI Dermatological ROS: negative for rash and skin lesion changes  Physical exam: pleasant male in no apparent distress. Blood pressure 133/80, pulse 95, temperature 98.7 F (37.1 C), temperature source Oral, resp. rate 18, SpO2 98.00%. Head: normocephalic. Neck: supple, no bruits, no JVD. Cardiac: no murmurs. Lungs: clear. Abdomen: soft, no tender, no mass. Extremities: no edema. CV: pulses palpable throughout  Neurologic Examination:                                                                                                      Mental Status: Alert, awake, oriented x 4, thought content appropriate.  Speech fluent without evidence of aphasia.  Able to follow 3 step commands without difficulty. Cranial  Nerves: II: Discs flat bilaterally; Visual fields grossly normal, pupils equal, round, reactive to light and accommodation III,IV, VI: ptosis not present, extra-ocular motions intact bilaterally V,VII: smile symmetric, facial light touch sensation normal bilaterally VIII: hearing normal bilaterally IX,X: gag reflex present XI: bilateral shoulder shrug XII: midline tongue extension without atrophy or fasciculations Motor: 5/5 upper extremities. 0/5 bilateral LE proximally and distally Tone and bulk:normal tone throughout; no atrophy noted Sensory: Pinprick and light touch intact absent from the knees down. No sensory level. Deep Tendon Reflexes:  2+ upper extremities. The right knee jerk is present but diminished in comparison to the left. Plantars: Right: mute   Left: mute Cerebellar: normal finger-to-nose,  Unable to test heel-to-shin due to weakness Gait:  Unable to test.    No results found for this basename: cbc, bmp, coags, chol, tri, ldl, hga1c    Results for orders placed during the hospital encounter of 12/17/13 (from the past 48 hour(s))  CBC WITH DIFFERENTIAL     Status: Abnormal   Collection Time    12/17/13  1:21 AM      Result Value Ref Range   WBC 8.9  4.0 - 10.5 K/uL   RBC 5.50  4.22 - 5.81 MIL/uL   Hemoglobin 17.1 (*) 13.0 - 17.0 g/dL   HCT 48.5  39.0 - 52.0 %   MCV 88.2  78.0 -  100.0 fL   MCH 31.1  26.0 - 34.0 pg   MCHC 35.3  30.0 - 36.0 g/dL   RDW 12.9  11.5 - 15.5 %   Platelets 279  150 - 400 K/uL   Neutrophils Relative % 64  43 - 77 %   Neutro Abs 5.7  1.7 - 7.7 K/uL   Lymphocytes Relative 25  12 - 46 %   Lymphs Abs 2.3  0.7 - 4.0 K/uL   Monocytes Relative 9  3 - 12 %   Monocytes Absolute 0.8  0.1 - 1.0 K/uL   Eosinophils Relative 1  0 - 5 %   Eosinophils Absolute 0.1  0.0 - 0.7 K/uL   Basophils Relative 0  0 - 1 %   Basophils Absolute 0.0  0.0 - 0.1 K/uL  COMPREHENSIVE METABOLIC PANEL     Status: Abnormal   Collection Time    12/17/13  1:21  AM      Result Value Ref Range   Sodium 140  137 - 147 mEq/L   Potassium 3.9  3.7 - 5.3 mEq/L   Chloride 98  96 - 112 mEq/L   CO2 23  19 - 32 mEq/L   Glucose, Bld 100 (*) 70 - 99 mg/dL   BUN 9  6 - 23 mg/dL   Creatinine, Ser 0.48 (*) 0.50 - 1.35 mg/dL   Calcium 9.9  8.4 - 10.5 mg/dL   Total Protein 8.6 (*) 6.0 - 8.3 g/dL   Albumin 4.8  3.5 - 5.2 g/dL   AST 47 (*) 0 - 37 U/L   ALT 71 (*) 0 - 53 U/L   Alkaline Phosphatase 87  39 - 117 U/L   Total Bilirubin 0.5  0.3 - 1.2 mg/dL   GFR calc non Af Amer >90  >90 mL/min   GFR calc Af Amer >90  >90 mL/min   Comment: (NOTE)     The eGFR has been calculated using the CKD EPI equation.     This calculation has not been validated in all clinical situations.     eGFR's persistently <90 mL/min signify possible Chronic Kidney     Disease.  ETHANOL     Status: Abnormal   Collection Time    12/17/13  1:21 AM      Result Value Ref Range   Alcohol, Ethyl (B) 233 (*) 0 - 11 mg/dL   Comment:            LOWEST DETECTABLE LIMIT FOR     SERUM ALCOHOL IS 11 mg/dL     FOR MEDICAL PURPOSES ONLY  URINALYSIS, ROUTINE W REFLEX MICROSCOPIC     Status: Abnormal   Collection Time    12/17/13  1:36 AM      Result Value Ref Range   Color, Urine YELLOW  YELLOW   APPearance CLEAR  CLEAR   Specific Gravity, Urine 1.002 (*) 1.005 - 1.030   pH 6.0  5.0 - 8.0   Glucose, UA NEGATIVE  NEGATIVE mg/dL   Hgb urine dipstick NEGATIVE  NEGATIVE   Bilirubin Urine NEGATIVE  NEGATIVE   Ketones, ur NEGATIVE  NEGATIVE mg/dL   Protein, ur NEGATIVE  NEGATIVE mg/dL   Urobilinogen, UA 0.2  0.0 - 1.0 mg/dL   Nitrite NEGATIVE  NEGATIVE   Leukocytes, UA NEGATIVE  NEGATIVE   Comment: MICROSCOPIC NOT DONE ON URINES WITH NEGATIVE PROTEIN, BLOOD, LEUKOCYTES, NITRITE, OR GLUCOSE <1000 mg/dL.  URINE RAPID DRUG SCREEN (HOSP PERFORMED)     Status:  None   Collection Time    12/17/13  1:36 AM      Result Value Ref Range   Opiates NONE DETECTED  NONE DETECTED   Cocaine NONE DETECTED   NONE DETECTED   Benzodiazepines NONE DETECTED  NONE DETECTED   Amphetamines NONE DETECTED  NONE DETECTED   Tetrahydrocannabinol NONE DETECTED  NONE DETECTED   Barbiturates NONE DETECTED  NONE DETECTED   Comment:            DRUG SCREEN FOR MEDICAL PURPOSES     ONLY.  IF CONFIRMATION IS NEEDED     FOR ANY PURPOSE, NOTIFY LAB     WITHIN 5 DAYS.                LOWEST DETECTABLE LIMITS     FOR URINE DRUG SCREEN     Drug Class       Cutoff (ng/mL)     Amphetamine      1000     Barbiturate      200     Benzodiazepine   925     Tricyclics       241     Opiates          300     Cocaine          300     THC              50    No results found.  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 Bladder and bowel function seem to be intact. Differential includes an acute demyelinating polyneuropathy like GBS and less likely an acute cord process ( no bladder/bowel involvement, no sensory level). Recommend: MRI T-L spine with and without contrast. LP if negative imaging. Will follow up.  Dorian Pod, MD 12/17/2013, 4:31 AM [facility]

## 2013-12-17 NOTE — ED Notes (Signed)
Admitting MD at bedside.

## 2013-12-17 NOTE — ED Notes (Signed)
Voided and no post void residual

## 2013-12-18 LAB — BASIC METABOLIC PANEL
BUN: 10 mg/dL (ref 6–23)
CO2: 24 meq/L (ref 19–32)
CREATININE: 0.62 mg/dL (ref 0.50–1.35)
Calcium: 9.7 mg/dL (ref 8.4–10.5)
Chloride: 100 mEq/L (ref 96–112)
GFR calc Af Amer: >90 mL/min (ref >90)
GFR calc non Af Amer: >90 mL/min (ref >90)
Glucose, Bld: 90 mg/dL (ref 70–99)
POTASSIUM: 4.2 meq/L (ref 3.7–5.3)
Sodium: 138 mEq/L (ref 137–147)

## 2013-12-18 LAB — CBC
HEMATOCRIT: 47.3 % (ref 39.0–52.0)
HEMOGLOBIN: 16.4 g/dL (ref 13.0–17.0)
MCH: 31.1 pg (ref 26.0–34.0)
MCHC: 34.7 g/dL (ref 30.0–36.0)
MCV: 89.6 fL (ref 78.0–100.0)
Platelets: 221 10*3/uL (ref 150–400)
RBC: 5.28 MIL/uL (ref 4.22–5.81)
RDW: 13.4 % (ref 11.5–15.5)
WBC: 7.4 10*3/uL (ref 4.0–10.5)

## 2013-12-18 LAB — MAGNESIUM: Magnesium: 2.2 mg/dL (ref 1.5–2.5)

## 2013-12-18 NOTE — Care Management Note (Unsigned)
    Page 1 of 1   12/18/2013     2:56:30 PM   CARE MANAGEMENT NOTE 12/18/2013  Patient:  Mertie ClauseMARTINEZARREDONDO,Frazier   Account Number:  [id number]  Date Initiated:  12/18/2013  Documentation initiated by:  Elmer BalesOBARGE,COURTNEY  Subjective/Objective Assessment:   Patient admitted with new onset paralysis of both lower limbs. Ethanol 233 at admission. Lives at home with his parent     Action/Plan:   Will follow for discharge needs   Anticipated DC Date:  12/18/2013   Anticipated DC Plan:  HOME/SELF CARE  In-house referral  Financial Counselor      DC Planning Services  CM consult  [facility]      Choice offered to / List presented to:             Status of service:  Completed, signed off Medicare Important Message given?   (If response is "NO", the following Medicare IM given date fields will be blank) Date Medicare IM given:   Date Additional Medicare IM given:    Discharge Disposition:    Per UR Regulation:  Reviewed for med. necessity/level of care/duration of stay  If discussed at Long Length of Stay Meetings, dates discussed:    Comments:  12/18/13 1200 Elmer Balesourtney Robarge RN, MSN, CM- With the help of Spanish speaking staff RN, CM verified that patient does not have a PCP.  Patient states that [facility]  Appointment was made for Friday, April 17th at 1000, as discharge is anticipated today.  Patient does not have any medications needs at this time.  Written information was provided and appointment was added to patient's AVS. Patient's RN updated.

## 2013-12-18 NOTE — Discharge Instructions (Signed)
1. You have to following appointment:  David OverlieNayana Abrol, MD  On 12/19/2013 10:00 AM  [address] [place] [id number] [phone]  2. If you have worsening of your symptoms or new symptoms arise, please call the clinic [phone]), or go to the ER immediately if symptoms are severe.   Intoxicacin alcohlica (Alcohol Intoxication) La intoxicacin alcohlica se produce cuando la cantidad de alcohol que se ha consumido daa la capacidad de funcionamiento mental y fsico. El alcohol deteriora directamente la actividad qumica normal del cerebro. Beber grandes cantidades de alcohol puede conducir a Building control surveyorcambios en el funcionamiento mental y en el comportamiento, y puede causar muchos efectos fsicos que pueden ser perjudiciales.  La intoxicacin alcohlica puede variar en gravedad desde leve hasta muy grave. Hay varios factores que pueden afectar el nivel de intoxicacin que se produce, como la edad de la persona, el sexo, el peso, la frecuencia de consumo de alcohol, y la presencia de otras enfermedades mdicas (como diabetes, convulsiones o enfermedades del corazn). Los niveles peligrosos de intoxicacin por alcohol pueden ocurrir Kerr-McGeecuando las personas beben grandes cantidades de alcohol en un corto periodo de tiempo (Walkervilleemborracharse). El alcohol tambin puede ser especialmente peligroso cuando se combina con ciertos medicamentos recetados o drogas "recreativas". SIGNOS Y SNTOMAS Algunos de los signos y sntomas comunes de intoxicacin leve por alcohol incluyen:  Prdida de la coordinacin.  Cambios en el estado de nimo y la conducta.  Incapacidad para razonar.  Hablar arrastrando las palabras. A medida que la intoxicacin por alcohol avanza a niveles ms graves, David Niecevan a Research officer, trade unionaparecer otros signos y sntomas. Estos pueden ser:  Vmitos.  Confusin y alteracin de Scientist, clinical (histocompatibility and immunogenetics)la memoria.  Disminucin de Engineer, manufacturing systemsla frecuencia respiratoria.  Convulsiones.  Prdida de la conciencia. DIAGNSTICO  El mdico le har una  historia clnica y un examen fsico. Se le preguntar acerca de la cantidad y el tipo de alcohol que ha consumido. Se le realizarn anlisis de sangre para medir la concentracin de alcohol en sangre. En muchos lugares, el nivel de alcohol en la sangre debe ser inferior a 80 mg / dL (5,18%0,08%) para poder conducir legalmente. Sin embargo, hay muchos efectos peligrosos del alcohol que pueden ocurrir con niveles mucho ms bajos.  TRATAMIENTO  Las personas con intoxicacin por alcohol a menudo no requieren TEFL teachertratamiento. La mayor parte de los efectos de la intoxicacin por alcohol son temporales, y desaparecen a medida que el alcohol abandona el cuerpo de forma natural. El profesional controlar su estado hasta que est lo suficientemente estable como para volver a casa. A veces se administran lquidos por va intravenosa para ayudar a evitar la deshidratacin.  INSTRUCCIONES PARA EL CUIDADO EN EL HOGAR  No conduzca vehculos despus de beber alcohol.  Mantngase hidratado. Beba gran cantidad de lquido para mantener la orina de tono claro o color amarillo plido. Evite la cafena.   Tome slo medicamentos de venta libre o recetados, segn las indicaciones del mdico.  SOLICITE ATENCIN MDICA SI:   Tiene vmitos persistentes.   No mejora luego de Time Warneralgunos das.  Se intoxica con alcohol con frecuencia. El mdico podr ayudarlo a decidir si debe consultar a un terapeuta especializado en el abuso de sustancias. SOLICITE ATENCIN MDICA DE INMEDIATO SI:   Se siente vacilante o tembloroso cuando trata de abandonar el hbito.   Comienza a temblar de manera incontrolable (convulsiones).   Vomita sangre. Puede ser sangre de color rojo brillante o similar al sedimento del caf negro.   Observa sangre en la materia fecal.  Puede ser de color rojo brillante o de aspecto alquitranado, con olor ftido.   Se siente mareado o se desmaya.  ASEGRESE DE QUE:   Comprende estas instrucciones.  Controlar  su afeccin.  Recibir ayuda de inmediato si no mejora o si empeora. Document Released: 08/21/2005 Document Revised: 04/23/2013 [facility] Patient Information 2014 [employer], [place].

## 2013-12-18 NOTE — Progress Notes (Signed)
UR complete.  Jayvin Hurrell RN, MSN 

## 2013-12-18 NOTE — H&P (Signed)
INTERNAL MEDICINE TEACHING SERVICE Attending Admission Note  Date: 12/18/2013  Patient name: David Calhoun  Medical record number: [id number]  Date of birth: 01/15/1986    I have seen and evaluated David Calhoun and discussed their care with the Residency Team.  28 yr old man with no pmhx presented after a night of heavy drinking indicating that he was unable to move his legs and had no sensation below his knees. Denied any injuries or recent acute illness. He admits to frequent alcohol use. On exam, his neurological findings were inconsistent on admission. He had a MRI of the entire spinal cord which showed no abnormalities. MRI of the brain without significant abnormalities. Of note, his BAL was 0.23 on admission. This morning, he is able to move his legs. He has intact sensation. He was able to hold his legs up for 10 seconds bilaterally for me. His DTRs are normal bilaterally in the LE and UE. Have PT see him this morning. If he is able to ambulate without difficulty, he can be DC home.  David BlueAlejandro Keyleen Cerrato, DO, FACP [facility] 12/18/2013, 10:54 AM

## 2013-12-18 NOTE — Progress Notes (Addendum)
NEURO HOSPITALIST PROGRESS NOTE   SUBJECTIVE:                                                                                                                        Feels much better today.  Walking without assistance. Complains of burning in his left foot.   OBJECTIVE:                                                                                                                           Vital signs in last 24 hours: Temp:  [97.6 F (36.4 C)-99.2 F (37.3 C)] 97.6 F (36.4 C) (04/16 0605) Pulse Rate:  [69-100] 87 (04/16 0605) Resp:  [16-20] 16 (04/16 0605) BP: (147-159)/(83-88) 158/88 mmHg (04/16 0605) SpO2:  [97 %-100 %] 100 % (04/16 0605)  Intake/Output from previous day: 04/15 0701 - 04/16 0700 In: 360 [P.O.:360] Out: 1150 [Urine:1150] Intake/Output this shift: Total I/O In: 240 [P.O.:240] Out: -  Nutritional status: General  History reviewed. No pertinent past medical history.    Neurologic Exam:  Mental Status: Alert, oriented,  Speech fluent without evidence of aphasia.  Able to follow 3 step commands. Cranial Nerves: II: Visual fields grossly normal, pupils equal, round, reactive to light and accommodation III,IV, VI: ptosis not present, extra-ocular motions intact bilaterally V,VII: smile symmetric, facial light touch sensation normal bilaterally VIII: hearing normal bilaterally IX,X: gag reflex present XI: bilateral shoulder shrug XII: midline tongue extension without atrophy or fasciculations  Motor: Right : Upper extremity   5/5    Left:     Upper extremity   5/5  Lower extremity   5/5     Lower extremity   5/5 Tone and bulk:normal tone throughout; no atrophy noted Sensory: Pinprick and light touch intact throughout, bilaterally Deep Tendon Reflexes:  Right: Upper Extremity   Left: Upper extremity   biceps (C-5 to C-6) 2/4   biceps (C-5 to C-6) 2/4 tricep (C7) 2/4    triceps (C7) 2/4 Brachioradialis (C6)  2/4  Brachioradialis (C6) 2/4  Lower Extremity Lower Extremity  quadriceps (L-2 to L-4) 2/4   quadriceps (L-2 to L-4) 2/4 Achilles (S1) 2/4   Achilles (S1) 2/4  Plantars: Right: downgoing   Left: downgoing Cerebellar: normal finger-to-nose,  normal heel-to-shin test Gait: Walks with narrow based gait, knees showed decreased flexion with movement, when walking his toes on his right foot are held off.  CV: pulses palpable throughout    Lab Results: Basic Metabolic Panel:  Recent Labs Lab 12/17/13 0121 12/18/13 0336  NA 140 138  K 3.9 4.2  CL 98 100  CO2 23 24  GLUCOSE 100* 90  BUN 9 10  CREATININE 0.48* 0.62  CALCIUM 9.9 9.7  MG  --  2.2    Liver Function Tests:  Recent Labs Lab 12/17/13 0121  AST 47*  ALT 71*  ALKPHOS 87  BILITOT 0.5  PROT 8.6*  ALBUMIN 4.8   No results found for this basename: LIPASE, AMYLASE,  in the last 168 hours No results found for this basename: AMMONIA,  in the last 168 hours  CBC:  Recent Labs Lab 12/17/13 0121 12/18/13 0336  WBC 8.9 7.4  NEUTROABS 5.7  --   HGB 17.1* 16.4  HCT 48.5 47.3  MCV 88.2 89.6  PLT 279 221    Cardiac Enzymes: No results found for this basename: CKTOTAL, CKMB, CKMBINDEX, TROPONINI,  in the last 168 hours  Lipid Panel: No results found for this basename: CHOL, TRIG, HDL, CHOLHDL, VLDL, LDLCALC,  in the last 168 hours  CBG: No results found for this basename: GLUCAP,  in the last 168 hours  Microbiology: No results found for this or any previous visit.  Coagulation Studies: No results found for this basename: LABPROT, INR,  in the last 72 hours  Imaging: Mr Brain Wo Contrast  12/17/2013   CLINICAL DATA:  28 year old male with bilateral lower extremity weakness and numbness. Initial encounter.  EXAM: MRI HEAD WITHOUT CONTRAST  MRI CERVICAL SPINE WITHOUT CONTRAST  TECHNIQUE: Multiplanar, multiecho pulse sequences of the brain and surrounding structures, and cervical spine, to include the  craniocervical junction and cervicothoracic junction, were obtained without intravenous contrast.  COMPARISON:  Thoracic and lumbar MRIs 0649 hr the same day.  FINDINGS: MRI HEAD FINDINGS  Cerebral volume is normal. No restricted diffusion to suggest acute infarction. No midline shift, mass effect, evidence of mass lesion, ventriculomegaly, extra-axial collection or acute intracranial hemorrhage. Cervicomedullary junction and pituitary are within normal limits. Major intracranial vascular flow voids are preserved, dominant distal right vertebral artery.  Minimal cerebral white matter T2 and FLAIR hyperintensity (e.g. Left frontal lobe white matter series 7, image 17). Nonspecific. No cortical encephalomalacia. Otherwise normal gray and white matter signal.  Visible internal auditory structures appear normal. Mastoids are clear. Visualized orbit soft tissues are within normal limits. Minor paranasal sinus mucosal thickening. Visualized scalp soft tissues are within normal limits. Normal bone marrow signal.  MRI CERVICAL SPINE FINDINGS  Mild straightening of cervical lordosis. No marrow edema or evidence of acute osseous abnormality.  Spinal cord signal is within normal limits at all visualized levels.  Patent cervical spinal canal and thecal sac without stenosis. Prominent upper cervical spine venous plexus, physiologic. No cervical disc herniation or foraminal stenosis.  Negative paraspinal soft tissues.  IMPRESSION: 1. No acute intracranial abnormality. Minimal nonspecific cerebral white matter signal changes. Otherwise normal MRI appearance of the brain. 2. Normal MRI appearance of the cervical spine.   Electronically Signed   By: Augusto Gamble M.D.   On: 12/17/2013 16:40   Mr Cervical Spine Wo Contrast  12/17/2013   CLINICAL DATA:  28 year old male with bilateral lower extremity weakness and numbness. Initial encounter.  EXAM: MRI HEAD WITHOUT CONTRAST  MRI CERVICAL  SPINE WITHOUT CONTRAST  TECHNIQUE: Multiplanar,  multiecho pulse sequences of the brain and surrounding structures, and cervical spine, to include the craniocervical junction and cervicothoracic junction, were obtained without intravenous contrast.  COMPARISON:  Thoracic and lumbar MRIs 0649 hr the same day.  FINDINGS: MRI HEAD FINDINGS  Cerebral volume is normal. No restricted diffusion to suggest acute infarction. No midline shift, mass effect, evidence of mass lesion, ventriculomegaly, extra-axial collection or acute intracranial hemorrhage. Cervicomedullary junction and pituitary are within normal limits. Major intracranial vascular flow voids are preserved, dominant distal right vertebral artery.  Minimal cerebral white matter T2 and FLAIR hyperintensity (e.g. Left frontal lobe white matter series 7, image 17). Nonspecific. No cortical encephalomalacia. Otherwise normal gray and white matter signal.  Visible internal auditory structures appear normal. Mastoids are clear. Visualized orbit soft tissues are within normal limits. Minor paranasal sinus mucosal thickening. Visualized scalp soft tissues are within normal limits. Normal bone marrow signal.  MRI CERVICAL SPINE FINDINGS  Mild straightening of cervical lordosis. No marrow edema or evidence of acute osseous abnormality.  Spinal cord signal is within normal limits at all visualized levels.  Patent cervical spinal canal and thecal sac without stenosis. Prominent upper cervical spine venous plexus, physiologic. No cervical disc herniation or foraminal stenosis.  Negative paraspinal soft tissues.  IMPRESSION: 1. No acute intracranial abnormality. Minimal nonspecific cerebral white matter signal changes. Otherwise normal MRI appearance of the brain. 2. Normal MRI appearance of the cervical spine.   Electronically Signed   By: Augusto GambleLee  Hall M.D.   On: 12/17/2013 16:40   Mr Thoracic Spine W Wo Contrast  12/17/2013   CLINICAL DATA:  Bilateral lower extremity weakness and numbness.  EXAM: MRI THORACIC AND LUMBAR  SPINE WITHOUT AND WITH CONTRAST  TECHNIQUE: Multiplanar and multiecho pulse sequences of the thoracic and lumbar spine were obtained without and with intravenous contrast.  CONTRAST:  20mL MULTIHANCE GADOBENATE DIMEGLUMINE 529 MG/ML IV SOLN  COMPARISON:  None.  FINDINGS: MR THORACIC SPINE FINDINGS  There is a tiny central disc bulge at T5-6 and a slight disc bulge to the right at T6-7. There is no significant neural impingement. The thoracic spinal cord appears normal. No mass lesion or myelopathy. No foraminal or spinal stenosis. There is no fracture or bone destruction. The paraspinal soft tissues appear normal. No pathologic enhancement after contrast administration.  MR LUMBAR SPINE FINDINGS  Normal conus tip at L1-2.  Normal paraspinal soft tissues.  There is no spinal or foraminal stenosis. The discs are normal. No facet arthritis. No neural impingement. Distal spinal cord is normal. No pathologic enhancement after contrast administration.  IMPRESSION: Normal MRIs of the thoracic and lumbar spine.   Electronically Signed   By: Geanie CooleyJim  Maxwell M.D.   On: 12/17/2013 08:34   Mr Lumbar Spine W Wo Contrast  12/17/2013   CLINICAL DATA:  Bilateral lower extremity weakness and numbness.  EXAM: MRI THORACIC AND LUMBAR SPINE WITHOUT AND WITH CONTRAST  TECHNIQUE: Multiplanar and multiecho pulse sequences of the thoracic and lumbar spine were obtained without and with intravenous contrast.  CONTRAST:  20mL MULTIHANCE GADOBENATE DIMEGLUMINE 529 MG/ML IV SOLN  COMPARISON:  None.  FINDINGS: MR THORACIC SPINE FINDINGS  There is a tiny central disc bulge at T5-6 and a slight disc bulge to the right at T6-7. There is no significant neural impingement. The thoracic spinal cord appears normal. No mass lesion or myelopathy. No foraminal or spinal stenosis. There is no fracture or bone destruction. The paraspinal soft tissues  appear normal. No pathologic enhancement after contrast administration.  MR LUMBAR SPINE FINDINGS  Normal  conus tip at L1-2.  Normal paraspinal soft tissues.  There is no spinal or foraminal stenosis. The discs are normal. No facet arthritis. No neural impingement. Distal spinal cord is normal. No pathologic enhancement after contrast administration.  IMPRESSION: Normal MRIs of the thoracic and lumbar spine.   Electronically Signed   By: Geanie Cooley M.D.   On: 12/17/2013 08:34       MEDICATIONS                                                                                                                        Scheduled: . pneumococcal 23 valent vaccine  0.5 mL Intramuscular Tomorrow-1000    ASSESSMENT/PLAN:                                                                                                             28 YO male with acute onset bilateral LE weakness and preserved reflexes.  Patient continues to improve and able to ambulate unassisted. MRI of brain, cervical, thoracic and lumbar spine are unremarkable. With marked inconsistency on exam psychogenic etiology of his complaints must be considered. No further recommendations per neurology .   Assessment and plan discussed with with attending physician and they are in agreement.    Felicie Morn PA-C [facility] [phone]  12/18/2013, 1:18 PM   Much improved gait today, reported circumferential numbness without clear dermatomal distribution. Also had marked inconsistencies on exam yesterday. Today, much improved. Imaging is normal. Course is not consistent with GBS and findings do not fit a peripheral distribution. I suspect psychogenic etiology, though if symptoms were to marked worsen again, could consider LP.   Ritta Slot, MD [facility] [phone]  If 7pm- 7am, please page neurology on call as listed in AMION.

## 2013-12-18 NOTE — Discharge Summary (Signed)
Name: David Calhoun MRN: [id number] DOB: 11/18/1985 27 y.o. PCP: Pcp Not In System  Date of Admission: 12/17/2013 12:41 AM Date of Discharge: 12/18/2013 Attending Physician: Jonah BlueAlejandro Paya, DO  Discharge Diagnosis: 1. Lower Extremity Weakness 2. Alcohol Abuse  Discharge Medications:   Medication List    Notice   You have not been prescribed any medications.      Disposition and follow-up:   David Calhoun [facility] in Stable condition.  At the hospital follow up visit please address:  1.  Leg weakness/numbness; Patient presented to the hospital w/ LE weakness from the knee down, after significant alcohol intoxication. The next day of hospital admission, patient had general resolution of symptoms, but still with some weakness. 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 May need psychiatry follow up given questionable self-harm in his past. Patient claims he feels very safe at home. Still w/ weakness and numbness?  Alcohol intake; Discussed the importance of cutting back, understands the consequences of significant alcohol abuse. Still drinking excessively?   2.  Labs / imaging needed at time of follow-up: none  3.  Pending labs/ test needing follow-up: none  Follow-up Appointments:     Follow-up Information   Follow up with Richarda OverlieABROL,NAYANA, MD On 12/19/2013. (10:00 AM)    Specialty:  Internal Medicine   Contact information:   [address] [place] [address] [phone]       Discharge Instructions: Discharge Orders   Future Appointments Provider Department Dept Phone   12/19/2013 10:00 AM [facility] Covering Provider [facility] [phone]   Future Orders Complete By Expires   Increase activity slowly  As directed       Consultations: Treatment Team:  Kym GroomNeuro1 [facility], MD  Procedures Performed:  Mr  Sherrin DaisyBrain Wo Contrast  12/17/2013   CLINICAL DATA:  28 year old male with bilateral lower extremity weakness and numbness. Initial encounter.  EXAM: MRI HEAD WITHOUT CONTRAST  MRI CERVICAL SPINE WITHOUT CONTRAST  TECHNIQUE: Multiplanar, multiecho pulse sequences of the brain and surrounding structures, and cervical spine, to include the craniocervical junction and cervicothoracic junction, were obtained without intravenous contrast.  COMPARISON:  Thoracic and lumbar MRIs 0649 hr the same day.  FINDINGS: MRI HEAD FINDINGS  Cerebral volume is normal. No restricted diffusion to suggest acute infarction. No midline shift, mass effect, evidence of mass lesion, ventriculomegaly, extra-axial collection or acute intracranial hemorrhage. Cervicomedullary junction and pituitary are within normal limits. Major intracranial vascular flow voids are preserved, dominant distal right vertebral artery.  Minimal cerebral white matter T2 and FLAIR hyperintensity (e.g. Left frontal lobe white matter series 7, image 17). Nonspecific. No cortical encephalomalacia. Otherwise normal gray and white matter signal.  Visible internal auditory structures appear normal. Mastoids are clear. Visualized orbit soft tissues are within normal limits. Minor paranasal sinus mucosal thickening. Visualized scalp soft tissues are within normal limits. Normal bone marrow signal.  MRI CERVICAL SPINE FINDINGS  Mild straightening of cervical lordosis. No marrow edema or evidence of acute osseous abnormality.  Spinal cord signal is within normal limits at all visualized levels.  Patent cervical spinal canal and thecal sac without stenosis. Prominent upper cervical spine venous plexus, physiologic. No cervical disc herniation or foraminal stenosis.  Negative paraspinal soft tissues.  IMPRESSION: 1. No acute intracranial abnormality. Minimal nonspecific cerebral white matter signal changes. Otherwise normal MRI appearance of the brain. 2. Normal MRI appearance of  the cervical spine.   Electronically Signed   By: Augusto Gamble M.D.   On: 12/17/2013 16:40   Mr Cervical Spine Wo Contrast  12/17/2013   CLINICAL DATA:  28 year old male with bilateral lower extremity weakness and numbness. Initial encounter.  EXAM: MRI HEAD WITHOUT CONTRAST  MRI CERVICAL SPINE WITHOUT CONTRAST  TECHNIQUE: Multiplanar, multiecho pulse sequences of the brain and surrounding structures, and cervical spine, to include the craniocervical junction and cervicothoracic junction, were obtained without intravenous contrast.  COMPARISON:  Thoracic and lumbar MRIs 0649 hr the same day.  FINDINGS: MRI HEAD FINDINGS  Cerebral volume is normal. No restricted diffusion to suggest acute infarction. No midline shift, mass effect, evidence of mass lesion, ventriculomegaly, extra-axial collection or acute intracranial hemorrhage. Cervicomedullary junction and pituitary are within normal limits. Major intracranial vascular flow voids are preserved, dominant distal right vertebral artery.  Minimal cerebral white matter T2 and FLAIR hyperintensity (e.g. Left frontal lobe white matter series 7, image 17). Nonspecific. No cortical encephalomalacia. Otherwise normal gray and white matter signal.  Visible internal auditory structures appear normal. Mastoids are clear. Visualized orbit soft tissues are within normal limits. Minor paranasal sinus mucosal thickening. Visualized scalp soft tissues are within normal limits. Normal bone marrow signal.  MRI CERVICAL SPINE FINDINGS  Mild straightening of cervical lordosis. No marrow edema or evidence of acute osseous abnormality.  Spinal cord signal is within normal limits at all visualized levels.  Patent cervical spinal canal and thecal sac without stenosis. Prominent upper cervical spine venous plexus, physiologic. No cervical disc herniation or foraminal stenosis.  Negative paraspinal soft tissues.  IMPRESSION: 1. No acute intracranial abnormality. Minimal nonspecific cerebral  white matter signal changes. Otherwise normal MRI appearance of the brain. 2. Normal MRI appearance of the cervical spine.   Electronically Signed   By: Augusto Gamble M.D.   On: 12/17/2013 16:40   Mr Thoracic Spine W Wo Contrast  12/17/2013   CLINICAL DATA:  Bilateral lower extremity weakness and numbness.  EXAM: MRI THORACIC AND LUMBAR SPINE WITHOUT AND WITH CONTRAST  TECHNIQUE: Multiplanar and multiecho pulse sequences of the thoracic and lumbar spine were obtained without and with intravenous contrast.  CONTRAST:  20mL MULTIHANCE GADOBENATE DIMEGLUMINE 529 MG/ML IV SOLN  COMPARISON:  None.  FINDINGS: MR THORACIC SPINE FINDINGS  There is a tiny central disc bulge at T5-6 and a slight disc bulge to the right at T6-7. There is no significant neural impingement. The thoracic spinal cord appears normal. No mass lesion or myelopathy. No foraminal or spinal stenosis. There is no fracture or bone destruction. The paraspinal soft tissues appear normal. No pathologic enhancement after contrast administration.  MR LUMBAR SPINE FINDINGS  Normal conus tip at L1-2.  Normal paraspinal soft tissues.  There is no spinal or foraminal stenosis. The discs are normal. No facet arthritis. No neural impingement. Distal spinal cord is normal. No pathologic enhancement after contrast administration.  IMPRESSION: Normal MRIs of the thoracic and lumbar spine.   Electronically Signed   By: Geanie Cooley M.D.   On: 12/17/2013 08:34   Mr Lumbar Spine W Wo Contrast  12/17/2013   CLINICAL DATA:  Bilateral lower extremity weakness and numbness.  EXAM: MRI THORACIC AND LUMBAR SPINE WITHOUT AND WITH CONTRAST  TECHNIQUE: Multiplanar and multiecho pulse sequences of the thoracic and lumbar spine were obtained without and with intravenous contrast.  CONTRAST:  20mL MULTIHANCE GADOBENATE DIMEGLUMINE 529 MG/ML IV SOLN  COMPARISON:  None.  FINDINGS: MR THORACIC SPINE FINDINGS  There is a tiny central disc bulge at T5-6 and a slight disc bulge to the  right at T6-7. There is no significant neural impingement. The thoracic spinal cord appears normal. No mass lesion or myelopathy. No foraminal or spinal stenosis. There is no fracture or bone destruction. The paraspinal soft tissues appear normal. No pathologic enhancement after contrast administration.  MR LUMBAR SPINE FINDINGS  Normal conus tip at L1-2.  Normal paraspinal soft tissues.  There is no spinal or foraminal stenosis. The discs are normal. No facet arthritis. No neural impingement. Distal spinal cord is normal. No pathologic enhancement after contrast administration.  IMPRESSION: Normal MRIs of the thoracic and lumbar spine.   Electronically Signed   By: Geanie Cooley M.D.   On: 12/17/2013 08:34   Admission HPI: The patient is a 28 YO man with no significant past medical history who is presenting to the hospital with loss of function of his legs. He was drinking alcohol the night prior and went to bed. When he awoke he was unable to move his legs and had lost sensation from the knee down. He denies that anything else was out of the usual. He had some mild diarrhea on Monday however this has since resolved. He denies fevers, chills, SOB, chest pain. He has not been sick recently or around others who are sick. He denies any cold or flulike symptoms in the last month. He denies abdominal pain or nausea or vomiting. He does drink alcohol most nights. He sometimes smokes cigarettes but this is not a regular occurrence for him. He does not do any drugs including cocaine, heroin, marijuana. He does not take any medications that are prescribed for him or for anyone else. He denies headaches now or in the recent past. He denies loss of bladder or bowel. He has feeling from his knees up and no problems with that sensation. He is not having any change in vision or hearing. He denies current or past sexual activity. He is hopeful that his legs will start working again. No problems with his speech.   Hospital  Course by problem list:  1. Lower Extremity Weakness- Patient presented w/ significant LE weakness distal to the knee, accompanied by significant numbness. Of note, patient admitted w/ EtOh of 233. Originally concerning for [employer], however, neurological exam very inconsistent. No previous signs, symptoms, or history of viral infection. No fever or leukocytosis. Neurology consulted in the ED, patient underwent MRI of thoracic and lumbar spine w/ no abnormalities, followed by MRI of cervical spine and brain w/ no acute abnormalities (report as above). Given inconsistent neurological findings and MRI results, LP was not done. Neurological exam quickly improved during the first day of admission, limited to sensory abnormalities located over the pretibial region. On 12/18/13, patient w/ complete return of motor function and no further sensory abnormalities. During course of admission, patient w/ relatively flat affect, seemed unphased by temporary loss of function of LE's. Given overall picture, etiology most likely psychogenic in nature. Patient also w/ past history of self harm during episode of alcohol intoxication.   2. Alcohol Abuse- Patient w/ past history of alcohol abuse, admitted w/ EtOh of 233. Patient admitted to frequent alcohol intoxication, possibly contributing to neurological findings as well. Social worker consulted, patient given information about alcohol abuse.   Discharge Vitals:   BP 158/88  Pulse 87  Temp(Src) 97.6 F (36.4 C) (Oral)  Resp 16  SpO2 100%  Discharge Labs:  Results for orders placed  during the hospital encounter of 12/17/13 (from the past 24 hour(s))  BASIC METABOLIC PANEL     Status: None   Collection Time    12/18/13  3:36 AM      Result Value Ref Range   Sodium 138  137 - 147 mEq/L   Potassium 4.2  3.7 - 5.3 mEq/L   Chloride 100  96 - 112 mEq/L   CO2 24  19 - 32 mEq/L   Glucose, Bld 90  70 - 99 mg/dL   BUN 10  6 - 23 mg/dL   Creatinine, Ser 1.610.62   0.50 - 1.35 mg/dL   Calcium 9.7  8.4 - 09.610.5 mg/dL   GFR calc non Af Amer >90  >90 mL/min   GFR calc Af Amer >90  >90 mL/min  MAGNESIUM     Status: None   Collection Time    12/18/13  3:36 AM      Result Value Ref Range   Magnesium 2.2  1.5 - 2.5 mg/dL  CBC     Status: None   Collection Time    12/18/13  3:36 AM      Result Value Ref Range   WBC 7.4  4.0 - 10.5 K/uL   RBC 5.28  4.22 - 5.81 MIL/uL   Hemoglobin 16.4  13.0 - 17.0 g/dL   HCT 04.547.3  40.939.0 - 81.152.0 %   MCV 89.6  78.0 - 100.0 fL   MCH 31.1  26.0 - 34.0 pg   MCHC 34.7  30.0 - 36.0 g/dL   RDW 91.413.4  78.211.5 - 95.615.5 %   Platelets 221  150 - 400 K/uL    Signed: Courtney ParisEden W Kiannah Grunow, MD 12/18/2013, 1:57 PM   Time Spent on Discharge: 45 minutes Services Ordered on Discharge: none Equipment Ordered on Discharge: none

## 2013-12-18 NOTE — Evaluation (Signed)
Occupational Therapy Evaluation Patient Details Name: David Calhoun MRN: [id number] DOB: 09/12/1985 Today's Date: 12/18/2013    History of Present Illness The patient is a 28 YO man with no significant past medical history who is presenting to the hospital with loss of function of his legs. He was drinking alcohol the night prior and went to bed. When he awoke he was unable to move his legs and had lost sensation from the knee down.  MRIs of head, cervical, thoracic, and lumbar spine are all negative.    Work up continues underway.    Clinical Impression   Pt admitted with above.  He was able to perform BADLs with supervision, retrieve items from floor, drawers, and cabinets with supervision.  No LOB noted.  Pt reaches to floor keeping knees extended, but was able to flex knees when performing tub transfer.  He moves slowly and deliberately keeping toes of Rt. Foot off the floor 75% of time.   He denies numbness, tingling, or pain, and states he feels he is almost back to normal.  No further OT recommended as he has assist as needed at home, and feel he will quickly progress back to his baseline.  Will sign off.     Follow Up Recommendations  No OT follow up;Supervision - Intermittent    Equipment Recommendations  None recommended by OT    Recommendations for Other Services       Precautions / Restrictions Precautions Precautions: None      Mobility Bed Mobility Overal bed mobility: Independent                Transfers Overall transfer level: Modified independent Equipment used: None                  Balance Overall balance assessment: Needs assistance Sitting-balance support: No upper extremity supported Sitting balance-Leahy Scale: Normal     Standing balance support: No upper extremity supported Standing balance-Leahy Scale: Good                              ADL Overall ADL's : Needs assistance/impaired Eating/Feeding:  Independent;Sitting   Grooming: Wash/dry hands;Wash/dry face;Oral care;Applying deodorant;Brushing hair;Supervision/safety;Standing   Upper Body Bathing: Set up;Sitting;Standing   Lower Body Bathing: Supervison/ safety;Sit to/from stand   Upper Body Dressing : Set up;Sitting   Lower Body Dressing: Supervision/safety;Sit to/from stand   Toilet Transfer: Supervision/safety;Ambulation;Regular Toilet;Comfort height toilet   Toileting- Clothing Manipulation and Hygiene: Supervision/safety;Sit to/from stand   Tub/ Shower Transfer: Supervision/safety;Ambulation Tub/Shower Transfer Details (indicate cue type and reason): Pt was shakey and uncertain when performing tub transfer, but no LOB and demonstrates safe technique Functional mobility during ADLs: Supervision/safety General ADL Comments: Pt able to retrieve items from drawers with supervision.  He was able to retrieve items from floor, but does not bed knees, indicating difficulty with that, however, he was able to bend knees without difficulty when performing tub transfer.  During reaching activities demonstrated no LOB.  Pt noted to initially to invert feet when ambulating and hold toes off floor.  Movements slowed and deliberate.  Pt denied numbness and tingling and stated he felt he is almost back to normal.  As he ambulated further, inversion decreased, but continued to hold toes of Rt. feet off floor 75% of time     Vision                     Perception  Praxis      Pertinent Vitals/Pain No pain     Hand Dominance Right   Extremity/Trunk Assessment Upper Extremity Assessment Upper Extremity Assessment: Overall WFL for tasks assessed   Lower Extremity Assessment Lower Extremity Assessment: Defer to PT evaluation   Cervical / Trunk Assessment Cervical / Trunk Assessment: Normal   Communication Communication Communication: Prefers language other than [place];Interpreter utilized (Arts administratorinterpreter line utilized)    Cognition Arousal/Alertness: Awake/alert Behavior During Therapy: WFL for tasks assessed/performed Overall Cognitive Status: Within Functional Limits for tasks assessed                     General Comments       Exercises       Shoulder Instructions      Home Living Family/patient expects to be discharged to:: Private residence Living Arrangements: Other relatives (brother) Available Help at Discharge: Family;Available PRN/intermittently Type of Home: Apartment Home Access: Level entry     Home Layout: One level     Bathroom Shower/Tub: Tub/shower unit Shower/tub characteristics: Engineer, building servicesCurtain Bathroom Toilet: Standard     Home Equipment: None   Additional Comments: Brother works during day, but mother lives close by and can assist as needed      Prior Functioning/Environment Level of Independence: Independent        Comments: Pt works as a Education administratorpainter.  Does not drive    OT Diagnosis:     OT Problem List:     OT Treatment/Interventions:      OT Goals(Current goals can be found in the care plan section)    OT Frequency:     Barriers to D/C:            Co-evaluation              End of Session Nurse Communication: Mobility status  Activity Tolerance: Patient tolerated treatment well Patient left: in chair;with call bell/phone within reach;with family/visitor present   Time: 1040-1104 OT Time Calculation (min): 24 min Charges:  OT General Charges $OT Visit: 1 Procedure OT Evaluation $Initial OT Evaluation Tier I: 1 Procedure OT Treatments $Self Care/Home Management : 8-22 mins G-Codes:    David Calhoun M Amiri Tritch 12/18/2013, 11:29 AM

## 2013-12-18 NOTE — Progress Notes (Signed)
Subjective: Patient seen at bedside this AM. LE's significantly improved today. Patient now denies sensory deficits in his legs and is able to ambulate w/ assistance. Still with bilateral knee pain, but with further discussion, it is very possible the patient fell forward onto his knees when he was previously intoxicated. No fever, chills, no SOB, or chest pain. Denies headache, neck pain or stiffness, dizziness, lightheadedness, or palpitations. Patient also w/out tremulousness on exam, no anxiety or irritability.   Objective: Vital signs in last 24 hours: Filed Vitals:   12/17/13 1031 12/17/13 1353 12/17/13 2201 12/18/13 0605  BP: 161/94 159/83 147/85 158/88  Pulse: 92 100 69 87  Temp: 97.8 F (36.6 C) 99.2 F (37.3 C) 98.8 F (37.1 C) 97.6 F (36.4 C)  TempSrc: Oral Oral Oral Oral  Resp: 20 20 16 16   SpO2: 99% 97% 99% 100%   Weight change:   Intake/Output Summary (Last 24 hours) at 12/18/13 1107 Last data filed at 12/18/13 0900  Gross per 24 hour  Intake    600 ml  Output    950 ml  Net   -350 ml   Physical Exam: General: Alert, cooperative, NAD.  HEENT: PERRL, EOMI. Moist mucus membranes Neck: Full range of motion without pain, supple, no lymphadenopathy or carotid bruits Lungs: Clear to ascultation bilaterally, normal work of respiration, no wheezes, rales, rhonchi Heart: RRR, no murmurs, gallops, or rubs Abdomen: Soft, non-tender, non-distended, BS + Extremities: No cyanosis, clubbing, or edema. Mild knee pain w/ palpation bilaterally, R>L. Also w/ mild pain w/ passive ROM of the right knee joint. Bruising seen over patellas bilaterally.  Neurologic: Alert & oriented X3, cranial nerves II-XII intact, strength grossly intact, 5/5 in upper and lower extremities, sensation intact to light touch in LE's. Able to ambulate on exam, slight difficulty w/ balance.  Lab Results: Basic Metabolic Panel:  Recent Labs Lab 12/17/13 0121 12/18/13 0336  NA 140 138  K 3.9 4.2    CL 98 100  CO2 23 24  GLUCOSE 100* 90  BUN 9 10  CREATININE 0.48* 0.62  CALCIUM 9.9 9.7  MG  --  2.2   Liver Function Tests:  Recent Labs Lab 12/17/13 0121  AST 47*  ALT 71*  ALKPHOS 87  BILITOT 0.5  PROT 8.6*  ALBUMIN 4.8   CBC:  Recent Labs Lab 12/17/13 0121 12/18/13 0336  WBC 8.9 7.4  NEUTROABS 5.7  --   HGB 17.1* 16.4  HCT 48.5 47.3  MCV 88.2 89.6  PLT 279 221   Urine Drug Screen: Drugs of Abuse     Component Value Date/Time   LABOPIA NONE DETECTED 12/17/2013 0136   COCAINSCRNUR NONE DETECTED 12/17/2013 0136   LABBENZ NONE DETECTED 12/17/2013 0136   AMPHETMU NONE DETECTED 12/17/2013 0136   THCU NONE DETECTED 12/17/2013 0136   LABBARB NONE DETECTED 12/17/2013 0136    Alcohol Level:  Recent Labs Lab 12/17/13 0121  ETH 233*   Urinalysis:  Recent Labs Lab 12/17/13 0136  COLORURINE YELLOW  LABSPEC 1.002*  PHURINE 6.0  GLUCOSEU NEGATIVE  HGBUR NEGATIVE  BILIRUBINUR NEGATIVE  KETONESUR NEGATIVE  PROTEINUR NEGATIVE  UROBILINOGEN 0.2  NITRITE NEGATIVE  LEUKOCYTESUR NEGATIVE   Studies/Results: Mr Sherrin Daisy Contrast  12/17/2013   CLINICAL DATA:  28 year old male with bilateral lower extremity weakness and numbness. Initial encounter.  EXAM: MRI HEAD WITHOUT CONTRAST  MRI CERVICAL SPINE WITHOUT CONTRAST  TECHNIQUE: Multiplanar, multiecho pulse sequences of the brain and surrounding structures, and cervical spine, to include  the craniocervical junction and cervicothoracic junction, were obtained without intravenous contrast.  COMPARISON:  Thoracic and lumbar MRIs 0649 hr the same day.  FINDINGS: MRI HEAD FINDINGS  Cerebral volume is normal. No restricted diffusion to suggest acute infarction. No midline shift, mass effect, evidence of mass lesion, ventriculomegaly, extra-axial collection or acute intracranial hemorrhage. Cervicomedullary junction and pituitary are within normal limits. Major intracranial vascular flow voids are preserved, dominant distal right  vertebral artery.  Minimal cerebral white matter T2 and FLAIR hyperintensity (e.g. Left frontal lobe white matter series 7, image 17). Nonspecific. No cortical encephalomalacia. Otherwise normal gray and white matter signal.  Visible internal auditory structures appear normal. Mastoids are clear. Visualized orbit soft tissues are within normal limits. Minor paranasal sinus mucosal thickening. Visualized scalp soft tissues are within normal limits. Normal bone marrow signal.  MRI CERVICAL SPINE FINDINGS  Mild straightening of cervical lordosis. No marrow edema or evidence of acute osseous abnormality.  Spinal cord signal is within normal limits at all visualized levels.  Patent cervical spinal canal and thecal sac without stenosis. Prominent upper cervical spine venous plexus, physiologic. No cervical disc herniation or foraminal stenosis.  Negative paraspinal soft tissues.  IMPRESSION: 1. No acute intracranial abnormality. Minimal nonspecific cerebral white matter signal changes. Otherwise normal MRI appearance of the brain. 2. Normal MRI appearance of the cervical spine.   Electronically Signed   By: Augusto Gamble M.D.   On: 12/17/2013 16:40   Mr Cervical Spine Wo Contrast  12/17/2013   CLINICAL DATA:  28 year old male with bilateral lower extremity weakness and numbness. Initial encounter.  EXAM: MRI HEAD WITHOUT CONTRAST  MRI CERVICAL SPINE WITHOUT CONTRAST  TECHNIQUE: Multiplanar, multiecho pulse sequences of the brain and surrounding structures, and cervical spine, to include the craniocervical junction and cervicothoracic junction, were obtained without intravenous contrast.  COMPARISON:  Thoracic and lumbar MRIs 0649 hr the same day.  FINDINGS: MRI HEAD FINDINGS  Cerebral volume is normal. No restricted diffusion to suggest acute infarction. No midline shift, mass effect, evidence of mass lesion, ventriculomegaly, extra-axial collection or acute intracranial hemorrhage. Cervicomedullary junction and pituitary  are within normal limits. Major intracranial vascular flow voids are preserved, dominant distal right vertebral artery.  Minimal cerebral white matter T2 and FLAIR hyperintensity (e.g. Left frontal lobe white matter series 7, image 17). Nonspecific. No cortical encephalomalacia. Otherwise normal gray and white matter signal.  Visible internal auditory structures appear normal. Mastoids are clear. Visualized orbit soft tissues are within normal limits. Minor paranasal sinus mucosal thickening. Visualized scalp soft tissues are within normal limits. Normal bone marrow signal.  MRI CERVICAL SPINE FINDINGS  Mild straightening of cervical lordosis. No marrow edema or evidence of acute osseous abnormality.  Spinal cord signal is within normal limits at all visualized levels.  Patent cervical spinal canal and thecal sac without stenosis. Prominent upper cervical spine venous plexus, physiologic. No cervical disc herniation or foraminal stenosis.  Negative paraspinal soft tissues.  IMPRESSION: 1. No acute intracranial abnormality. Minimal nonspecific cerebral white matter signal changes. Otherwise normal MRI appearance of the brain. 2. Normal MRI appearance of the cervical spine.   Electronically Signed   By: Augusto Gamble M.D.   On: 12/17/2013 16:40   Mr Thoracic Spine W Wo Contrast  12/17/2013   CLINICAL DATA:  Bilateral lower extremity weakness and numbness.  EXAM: MRI THORACIC AND LUMBAR SPINE WITHOUT AND WITH CONTRAST  TECHNIQUE: Multiplanar and multiecho pulse sequences of the thoracic and lumbar spine were obtained without and with  intravenous contrast.  CONTRAST:  20mL MULTIHANCE GADOBENATE DIMEGLUMINE 529 MG/ML IV SOLN  COMPARISON:  None.  FINDINGS: MR THORACIC SPINE FINDINGS  There is a tiny central disc bulge at T5-6 and a slight disc bulge to the right at T6-7. There is no significant neural impingement. The thoracic spinal cord appears normal. No mass lesion or myelopathy. No foraminal or spinal stenosis. There  is no fracture or bone destruction. The paraspinal soft tissues appear normal. No pathologic enhancement after contrast administration.  MR LUMBAR SPINE FINDINGS  Normal conus tip at L1-2.  Normal paraspinal soft tissues.  There is no spinal or foraminal stenosis. The discs are normal. No facet arthritis. No neural impingement. Distal spinal cord is normal. No pathologic enhancement after contrast administration.  IMPRESSION: Normal MRIs of the thoracic and lumbar spine.   Electronically Signed   By: Geanie CooleyJim  Maxwell M.D.   On: 12/17/2013 08:34   Mr Lumbar Spine W Wo Contrast  12/17/2013   CLINICAL DATA:  Bilateral lower extremity weakness and numbness.  EXAM: MRI THORACIC AND LUMBAR SPINE WITHOUT AND WITH CONTRAST  TECHNIQUE: Multiplanar and multiecho pulse sequences of the thoracic and lumbar spine were obtained without and with intravenous contrast.  CONTRAST:  20mL MULTIHANCE GADOBENATE DIMEGLUMINE 529 MG/ML IV SOLN  COMPARISON:  None.  FINDINGS: MR THORACIC SPINE FINDINGS  There is a tiny central disc bulge at T5-6 and a slight disc bulge to the right at T6-7. There is no significant neural impingement. The thoracic spinal cord appears normal. No mass lesion or myelopathy. No foraminal or spinal stenosis. There is no fracture or bone destruction. The paraspinal soft tissues appear normal. No pathologic enhancement after contrast administration.  MR LUMBAR SPINE FINDINGS  Normal conus tip at L1-2.  Normal paraspinal soft tissues.  There is no spinal or foraminal stenosis. The discs are normal. No facet arthritis. No neural impingement. Distal spinal cord is normal. No pathologic enhancement after contrast administration.  IMPRESSION: Normal MRIs of the thoracic and lumbar spine.   Electronically Signed   By: Geanie CooleyJim  Maxwell M.D.   On: 12/17/2013 08:34   Medications: I have reviewed the patient's current medications. Scheduled Meds: . pneumococcal 23 valent vaccine  0.5 mL Intramuscular Tomorrow-1000    Continuous Infusions:  PRN Meds:. Assessment/Plan: David Calhoun is a 28 y.o. male w/ PMHx of alcohol abuse, admitted for acute onset LE weakness.   LE Weakness- Patient still w/ weakness and sensory losses on exam on the evening of 12/17/13, however, these deficits have since resolved. Patient still w/ unsteady gait w/ ambulation. MRI studies of lumbar/thoracic/cervical spine unremarkable. MRI brain also w/ no acute intracranial abnormality, only minimal nonspecific cerebral white matter signal changes. Otherwise normal MRI appearance of the brain. Originally, questioned [employer] vs spinal cord compression, however, patient w/ very inconsistent neurological exam findings and rapid improvement in symptoms. No other neurological complaints, no bowel or bladder incontinence, no SOB or respiratory difficulty. Suspect findings are generally psychogenic in nature, stable for discharge w/ close outpatient follow up. -Hold off on LP given resolution of symptoms -Neurology following, appreciate recs -Neuro checks -PT/OT  Alcohol Abuse- Patient admitted w/ EtOh of 233. No agitation or anxiety overnight, CIWA scores 0. Alcohol intoxication likely resulted in a fall accounting for knee pain and bruising.  -Social Work consult  Dispo: Anticipated discharge today.  The patient does not have a current PCP (Pcp Not In System) and [facility] after [facility].  The  patient does not have transportation limitations that hinder transportation to clinic appointments.  .Services Needed at time of discharge: Y = Yes, Blank = No PT:   OT:   RN:   Equipment:   Other:     LOS: 1 day   Courtney ParisEden W Joseph Bias, MD 12/18/2013, 11:07 AM

## 2013-12-18 NOTE — Progress Notes (Signed)
Went in to reassess patient while lab was drawing blood and saw a significant improvement with the patient. He could feel my hands on his legs and was able to move them purposefully. He could lift each leg into the air with some weakness against gravity. Pt described some numbness still but definite improvement and no increased paralysis or difficulty breathing. Will continue to monitor. [place] ScrapeSarah E Sherrill Buikema

## 2013-12-18 NOTE — Evaluation (Signed)
Physical Therapy Evaluation Patient Details Name: David Calhoun MRN: [id number] DOB: 09/27/1985 Today's Date: 12/18/2013   History of Present Illness  The patient is a 28 YO man with no significant past medical history who is presenting to the hospital with loss of function of his legs. He was drinking alcohol the night prior and went to bed. When he awoke he was unable to move his legs and had lost sensation from the knee down.  MRIs of head, cervical, thoracic, and lumbar spine are all negative.    Work up continues underway.    Clinical Impression  Patient independent with mobility, no acute PT needs, will sign off.     Follow Up Recommendations No PT follow up    Equipment Recommendations  None recommended by PT    Recommendations for Other Services       Precautions / Restrictions Precautions Precautions: None      Mobility  Bed Mobility Overal bed mobility: Independent                Transfers Overall transfer level: Modified independent Equipment used: None                Ambulation/Gait Ambulation/Gait assistance: Independent Ambulation Distance (Feet): 420 Feet Assistive device: None Gait Pattern/deviations: Step-through pattern (walking with bilateral toes dorsiflexed) Gait velocity: decreased Gait velocity interpretation: Below normal speed for age/gender General Gait Details: during ambulation, patient walking with toes and forefoot raised of the ground, reports that there is paresthesias and pain when walking, however, when cued to tap his toes he reported that the sensation improved. Advised patient to perform bilateral toe taps prior to ambulating as long as symptoms persist.  Stairs            Wheelchair Mobility    Modified Rankin (Stroke Patients Only)       Balance Overall balance assessment: Needs assistance Sitting-balance support: No upper extremity supported Sitting balance-Leahy Scale: Normal     Standing  balance support: No upper extremity supported Standing balance-Leahy Scale: Good                               Pertinent Vitals/Pain     Home Living Family/patient expects to be discharged to:: Private residence Living Arrangements: Other relatives (brother) Available Help at Discharge: Family;Available PRN/intermittently Type of Home: Apartment Home Access: Level entry     Home Layout: One level Home Equipment: None Additional Comments: Brother works during day, but mother lives close by and can assist as needed    Prior Function Level of Independence: Independent         Comments: Pt works as a Education administratorpainter.  Does not drive     Hand Dominance   Dominant Hand: Right    Extremity/Trunk Assessment   Upper Extremity Assessment: Overall WFL for tasks assessed           Lower Extremity Assessment: Overall WFL for tasks assessed (no weakness upon testing)      Cervical / Trunk Assessment: Normal  Communication   Communication: Prefers language other than [place];Interpreter utilized (Arts administratorinterpreter line utilized)  Cognition Arousal/Alertness: Awake/alert Behavior During Therapy: WFL for tasks assessed/performed Overall Cognitive Status: Within Functional Limits for tasks assessed                      General Comments      Exercises        Assessment/Plan  PT Assessment Patent does not need any further PT services  PT Diagnosis     PT Problem List    PT Treatment Interventions     PT Goals (Current goals can be found in the Care Plan section) Acute Rehab PT Goals Patient Stated Goal: none states PT Goal Formulation: No goals set, d/c therapy    Frequency     Barriers to discharge        Co-evaluation               End of Session   Activity Tolerance: Patient tolerated treatment well Patient left: in chair;with call bell/phone within reach;with family/visitor present Nurse Communication: Mobility status  SESSION  PERFORMED VIA IN PERSON INTERPRETER Tyron Russell(Yeni, RN)        Time: 4098-1191: 1218-1235 PT Time Calculation (min): 17 min   Charges:   PT Evaluation $Initial PT Evaluation Tier I: 1 Procedure PT Treatments $Gait Training: 8-22 mins   PT G Codes:          Fabio AsaDevon J Onisha Cedeno 12/18/2013, 2:05 PM  Charlotte Crumbevon Melany Wiesman, PT DPT  [phone]

## 2013-12-19 ENCOUNTER — Encounter: Payer: Self-pay | Admitting: Internal Medicine

## 2013-12-19 ENCOUNTER — Ambulatory Visit: Payer: No Typology Code available for payment source | Attending: Internal Medicine | Admitting: Internal Medicine

## 2013-12-19 VITALS — BP 135/85 | HR 88 | Temp 98.0°F | Resp 16 | Ht 69.0 in | Wt 195.0 lb

## 2013-12-19 DIAGNOSIS — F17200 Nicotine dependence, unspecified, uncomplicated: Secondary | ICD-10-CM | POA: Insufficient documentation

## 2013-12-19 DIAGNOSIS — M79609 Pain in unspecified limb: Secondary | ICD-10-CM

## 2013-12-19 DIAGNOSIS — R5383 Other fatigue: Secondary | ICD-10-CM

## 2013-12-19 DIAGNOSIS — M79669 Pain in unspecified lower leg: Secondary | ICD-10-CM

## 2013-12-19 DIAGNOSIS — Z09 Encounter for follow-up examination after completed treatment for conditions other than malignant neoplasm: Secondary | ICD-10-CM | POA: Insufficient documentation

## 2013-12-19 DIAGNOSIS — R5381 Other malaise: Secondary | ICD-10-CM | POA: Insufficient documentation

## 2013-12-19 NOTE — Progress Notes (Signed)
LCSW met with patient who stated that he had not been drinking since he had been discharged from the hospital 2 days ago.  Patient stated that he had family support but would be open to additional support.  LCSW recommended that patient go to [facility] in order to seek services for substance abuse treatment.   Christene Lye MSW, LCSW

## 2013-12-19 NOTE — Progress Notes (Signed)
Patient ID: David Calhoun, male   DOB: 03/23/1986, 28 y.o.   MRN: [id number]   CC:  HPI: 28 year old male seen as a posthospital followup. Hospitalized between 4/15 and 4/16 for bilateral lower extremity weakness. He presented to the hospital with inability to walk. The patient has a history of alcohol dependence and admitted to drinking alcohol the night prior to his admission. He was evaluated by neurology, had an extensive workup including MRI of the brain, cervical spine, thoracic spine, and lumbar spine which was negative. EtOH level was 233, UDS was negative, HIV test was negative. Patient was evaluated by physical therapy and was found to have independent mobility. Today he is here in the clinic for a followup. He states that he has not had alcohol in 2 days. He is able to ambulate without any difficulty but complains of pain in his right calf associated with claudication. He smokes 5 cigarettes a day.     No Known Allergies History reviewed. No pertinent past medical history. No current outpatient prescriptions on file prior to visit.   No current facility-administered medications on file prior to visit.   Family History  Problem Relation Age of Onset  . Diabetes Mother   . Diabetes Father    History   Social History  . Marital Status: Single    Spouse Name: N/A    Number of Children: N/A  . Years of Education: N/A   Occupational History  . Not on file.   Social History Main Topics  . Smoking status: Current Some Day Smoker  . Smokeless tobacco: Not on file  . Alcohol Use: Yes     Comment: occasional  . Drug Use: No  . Sexual Activity: Not on file   Other Topics Concern  . Not on file   Social History Narrative  . No narrative on file    Review of Systems  Constitutional: Negative for fever, chills, diaphoresis, activity change, appetite change and fatigue.  HENT: Negative for ear pain, nosebleeds, congestion, facial swelling, rhinorrhea, neck pain,  neck stiffness and ear discharge.   Eyes: Negative for pain, discharge, redness, itching and visual disturbance.  Respiratory: Negative for cough, choking, chest tightness, shortness of breath, wheezing and stridor.   Cardiovascular: Negative for chest pain, palpitations and leg swelling.  Gastrointestinal: Negative for abdominal distention.  Genitourinary: Negative for dysuria, urgency, frequency, hematuria, flank pain, decreased urine volume, difficulty urinating and dyspareunia.  Musculoskeletal: Negative for back pain, joint swelling, arthralgias and gait problem.  Neurological: Negative for dizziness, tremors, seizures, syncope, facial asymmetry, speech difficulty, weakness, light-headedness, numbness and headaches.  Hematological: Negative for adenopathy. Does not bruise/bleed easily.  Psychiatric/Behavioral: Negative for hallucinations, behavioral problems, confusion, dysphoric mood, decreased concentration and agitation.    Objective:   Filed Vitals:   12/19/13 1018  BP: 135/85  Pulse: 88  Temp: 98 F (36.7 C)  Resp: 16    Physical Exam  Constitutional: Appears well-developed and well-nourished. No distress.  HENT: Normocephalic. External right and left ear normal. Oropharynx is clear and moist.  Eyes: Conjunctivae and EOM are normal. PERRLA, no scleral icterus.  Neck: Normal ROM. Neck supple. No JVD. No tracheal deviation. No thyromegaly.  CVS: RRR, S1/S2 +, no murmurs, no gallops, no carotid bruit.  Pulmonary: Effort and breath sounds normal, no stridor, rhonchi, wheezes, rales.  Abdominal: Soft. BS +,  no distension, tenderness, rebound or guarding.  Musculoskeletal: Normal range of motion. No edema and no tenderness.  Lymphadenopathy: No lymphadenopathy noted, cervical, inguinal.  Neuro: Alert. Normal reflexes, muscle tone coordination. No cranial nerve deficit. Skin: Skin is warm and dry. No rash noted. Not diaphoretic. No erythema. No pallor.  Psychiatric: Normal mood  and affect. Behavior, judgment, thought content normal.   Lab Results  Component Value Date   WBC 7.4 12/18/2013   HGB 16.4 12/18/2013   HCT 47.3 12/18/2013   MCV 89.6 12/18/2013   PLT 221 12/18/2013   Lab Results  Component Value Date   CREATININE 0.62 12/18/2013   BUN 10 12/18/2013   NA 138 12/18/2013   K 4.2 12/18/2013   CL 100 12/18/2013   CO2 24 12/18/2013    No results found for this basename: HGBA1C   Lipid Panel  No results found for this basename: chol, trig, hdl, cholhdl, vldl, ldlcalc       Assessment and plan:   Patient Active Problem List   Diagnosis Date Noted  . 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 Alcohol abuse 03/18/2013  . Suicide and self-inflicted injury by cutting and piercing instrument 03/18/2013   Extremity weakness Patient was preserved reflexes Differential included psychogenic etiology No clear dermatomal distribution Workup negative Neurology ruled out Guillain-Barr syndrome   Right calf pain Will order venous Doppler to rule out DVT  Patient to followup in 2-3 months for a full physical       The patient was given clear instructions to go to ER or return to medical center if symptoms don't improve, worsen or new problems develop. The patient verbalized understanding. The patient was told to call to get any lab results if not heard anything in the next week.

## 2013-12-19 NOTE — Progress Notes (Signed)
Pt is here to establish care. Pt was in the ED for numbness of the feet. Pt was unable to walk. Pt is here following up after his ED visit.

## 2013-12-22 ENCOUNTER — Ambulatory Visit (HOSPITAL_COMMUNITY)
Admission: RE | Admit: 2013-12-22 | Discharge: 2013-12-22 | Disposition: A | Discharge status: Home / Self Care | Payer: No Typology Code available for payment source | Source: Ambulatory Visit | Attending: Internal Medicine | Admitting: Internal Medicine

## 2013-12-22 DIAGNOSIS — M79609 Pain in unspecified limb: Secondary | ICD-10-CM | POA: Insufficient documentation

## 2013-12-22 DIAGNOSIS — M79669 Pain in unspecified lower leg: Secondary | ICD-10-CM

## 2013-12-22 NOTE — Progress Notes (Signed)
VASCULAR LAB PRELIMINARY  PRELIMINARY  PRELIMINARY  PRELIMINARY  Bilateral lower extremity venous Dopplers completed.    Preliminary report:  There is no DVT or SVT in the bilateral lower extremities.  Kern AlbertaCandace R Torry Adamczak, RVT 12/22/2013, 11:12 AM

## 2013-12-24 NOTE — Discharge Summary (Signed)
  Date: 12/24/2013  Patient name: David Calhoun  Medical record number: [id number]  Date of birth: 11/24/1985   This patient has been seen and the plan of care was discussed with the house staff. Please see their note for complete details. I concur with their findings and plan.  Jonah BlueAlejandro Keshawn Fiorito, DO, FACP [facility] 12/24/2013, 2:44 PM

## 2014-02-02 ENCOUNTER — Ambulatory Visit: Payer: Self-pay | Attending: Internal Medicine

## 2014-04-20 ENCOUNTER — Ambulatory Visit: Payer: Self-pay | Admitting: Internal Medicine

## 2017-04-18 ENCOUNTER — Ambulatory Visit (INDEPENDENT_AMBULATORY_CARE_PROVIDER_SITE_OTHER): Payer: Self-pay

## 2017-04-18 ENCOUNTER — Ambulatory Visit (INDEPENDENT_AMBULATORY_CARE_PROVIDER_SITE_OTHER): Payer: Worker's Compensation | Admitting: Physician Assistant

## 2017-04-18 ENCOUNTER — Encounter: Payer: Self-pay | Admitting: Physician Assistant

## 2017-04-18 VITALS — BP 123/80 | HR 80 | Temp 98.5°F | Resp 16 | Ht 69.0 in | Wt 215.0 lb

## 2017-04-18 DIAGNOSIS — S91331A Puncture wound without foreign body, right foot, initial encounter: Secondary | ICD-10-CM

## 2017-04-18 DIAGNOSIS — M25571 Pain in right ankle and joints of right foot: Secondary | ICD-10-CM | POA: Diagnosis not present

## 2017-04-18 DIAGNOSIS — Z23 Encounter for immunization: Secondary | ICD-10-CM

## 2017-04-18 LAB — POCT SEDIMENTATION RATE: POCT SED RATE: 6 mm/hr (ref 0–22)

## 2017-04-18 NOTE — Progress Notes (Signed)
    04/18/2017 10:56 AM   DOB: 08/11/1986 / MRN: [id number]  SUBJECTIVE:  David Calhoun is a 31 y.o. male presenting for "stepping on a nail" 3 days ago.  Tells me he was wearing his shoe.  Is here for a TDAP.  Is having some pain about the foot.   He has No Known Allergies.   He  has no past medical history on file.    He  reports that he has quit smoking. He has never used smokeless tobacco. He reports that he drinks alcohol. He reports that he does not use drugs. He  has no sexual activity history on file. The patient  has no past surgical history on file.  His family history includes Diabetes in his father and mother.  Review of Systems  Constitutional: Negative for chills and fever.  Skin: Negative for itching and rash.  Neurological: Negative for dizziness and headaches.    The problem list and medications were reviewed and updated by myself where necessary and exist elsewhere in the encounter.   OBJECTIVE:  BP 123/80 (BP Location: Right Arm, Patient Position: Sitting, Cuff Size: Normal)   Pulse 80   Temp 98.5 F (36.9 C) (Oral)   Resp 16   Ht 5\' 9"  (1.753 m)   Wt 215 lb (97.5 kg)   SpO2 99%   BMI 31.75 kg/m   Physical Exam  Constitutional: He is oriented to person, place, and time.  Cardiovascular: Normal rate.   Pulmonary/Chest: Effort normal.  Neurological: He is alert and oriented to person, place, and time.  Skin: Skin is warm and dry.    No results found for this or any previous visit (from the past 72 hour(s)).  Dg Foot 2 Views Right  Result Date: 04/18/2017 CLINICAL DATA:  Stepped on nail. EXAM: RIGHT FOOT - 2 VIEW COMPARISON:  Right foot x-rays dated March 07, 2013. FINDINGS: There is no evidence of fracture or dislocation. There is no evidence of arthropathy or other focal bone abnormality. No radiopaque foreign body. Soft tissues are unremarkable. IMPRESSION: Negative. Electronically Signed   By: Obie DredgeWilliam T Derry M.D.   On: 04/18/2017 10:42   Lab  Results  Component Value Date   POCTSEDRATE 6 04/18/2017     ASSESSMENT AND WUJW:[id number]  David Fabiandan was seen today for foot injury.  Diagnoses and all orders for this visit:  Pain in joint involving right ankle and foot  Nail wound of right foot, initial encounter: If sed rate elevated will start doxy.  If negative and still having pain will see him back in about 2 weeks.  -     Tdap vaccine greater than or equal to 7yo IM -     Cancel: Sedimentation Rate -     DG Foot 2 Views Right; Future -     POCT SEDIMENTATION RATE    The patient is advised to call or return to clinic if he does not see an improvement in symptoms, or to seek the care of the closest emergency department if he worsens with the above plan.   Deliah BostonMichael Marquasha Brutus, MHS, PA-C [facility] 04/18/2017 10:56 AM

## 2017-04-18 NOTE — Patient Instructions (Addendum)
  Take 800 mg of ibuprofen every 8 hours as needed for pain.    IF you received an x-ray today, you will receive an [facility]. Please contact [facility] at [phone] with questions or concerns regarding your invoice.   IF you received labwork today, you will receive an invoice from [facility]. Please contact [facility] at [phone] with questions or concerns regarding your invoice.   Our billing staff will not be able to assist you with questions regarding bills from these companies.  You will be contacted with the lab results as soon as they are available. The fastest way to get your results is to activate your My Chart account. Instructions are located on the last page of this paperwork. If you have not heard from us regarding the results in 2 weeks, please contact this office.

## 2017-04-24 LAB — SEDIMENTATION RATE

## 2018-01-26 IMAGING — DX DG FOOT 2V*R*
2 series · 2 of 2 positions shown · non-contrast
Comparison: Right foot x-rays dated March 07, 2013.

CLINICAL DATA: Stepped on nail.

EXAM:
RIGHT FOOT - 2 VIEW

[foot ap]
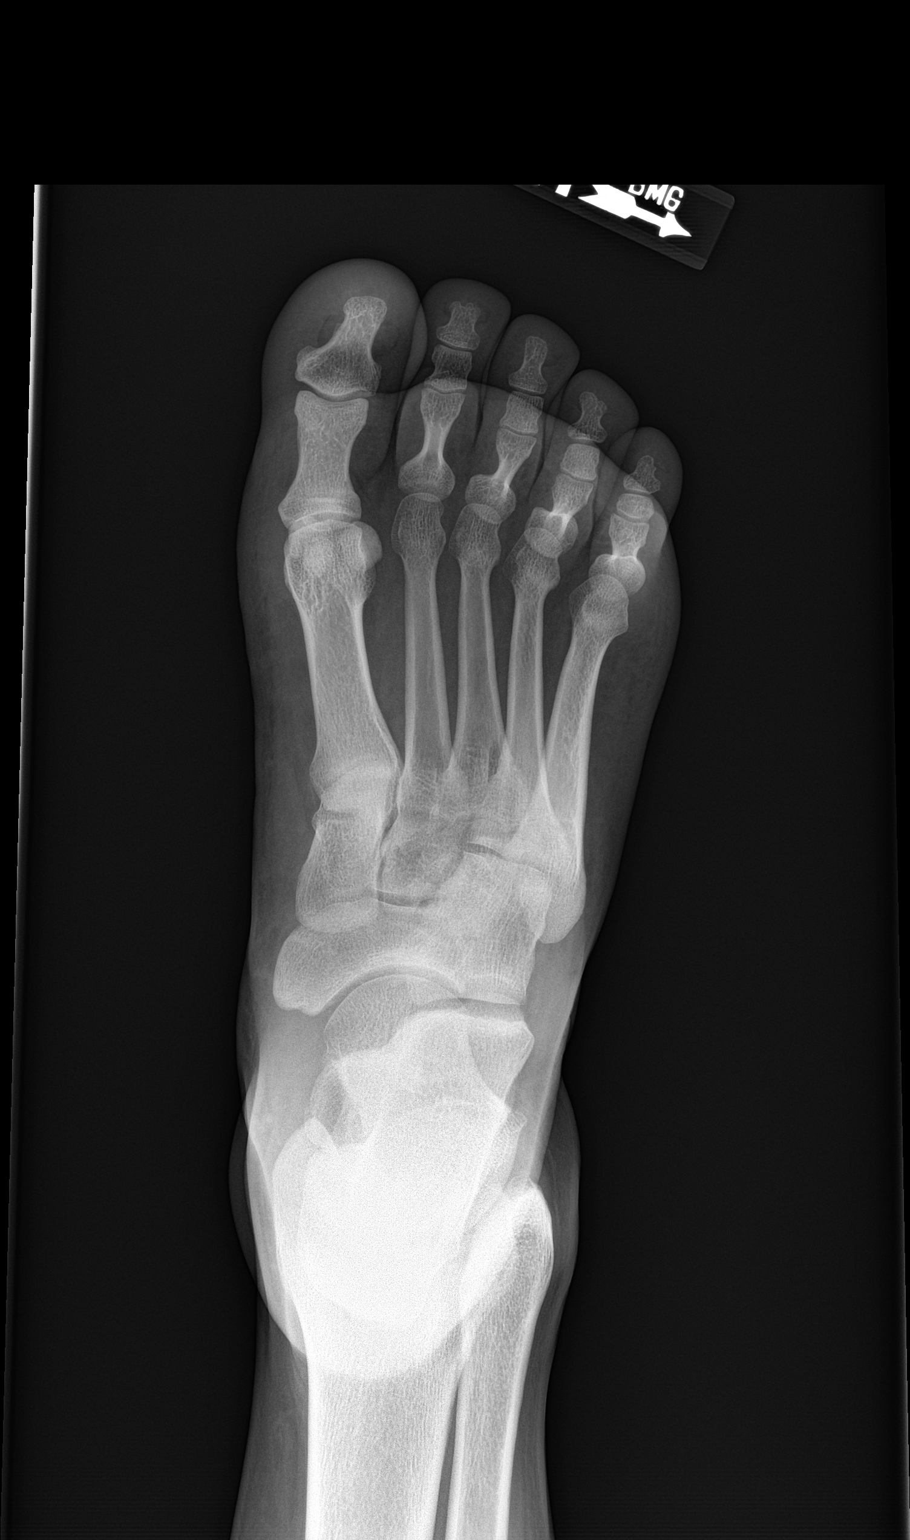

[foot lat]
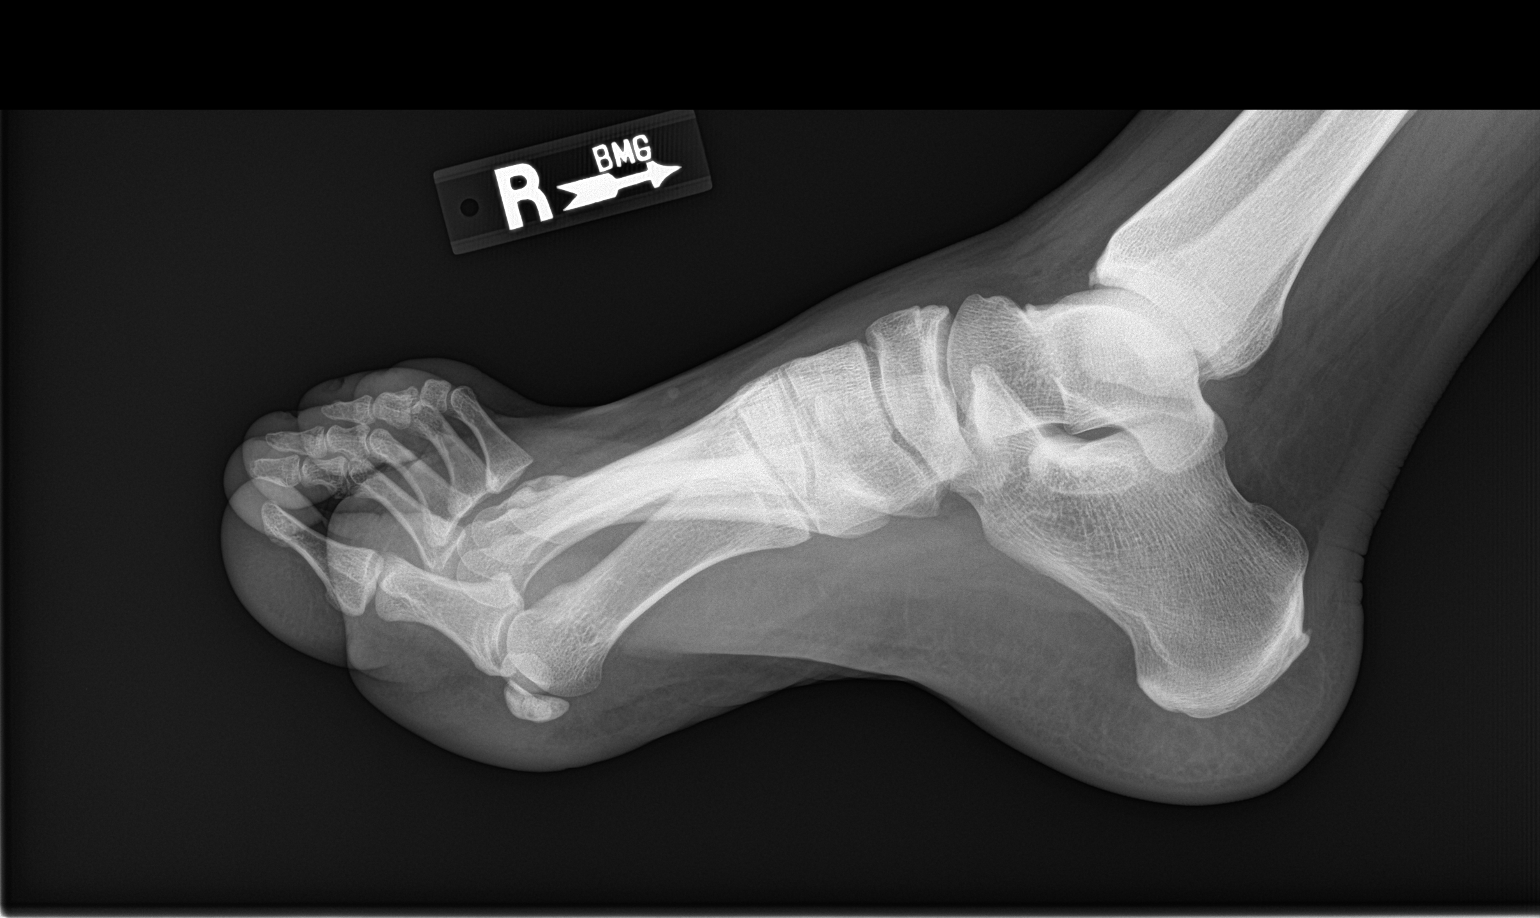

[2 of 2 positions shown; findings below may reference images not displayed]

FINDINGS: There is no evidence of fracture or dislocation. There is no
evidence of arthropathy or other focal bone abnormality. No
radiopaque foreign body. Soft tissues are unremarkable.
IMPRESSION: Negative.
